# Patient Record
Sex: Female | Born: 1975 | Race: White | Hispanic: No | Marital: Single | Smoking: Former smoker
Health system: Southern US, Community
[De-identification: ages and names within clinical notes are randomized; demographics above are authoritative.]

## PROBLEM LIST (undated history)

## (undated) DIAGNOSIS — F419 Anxiety disorder, unspecified: Secondary | ICD-10-CM

## (undated) DIAGNOSIS — J45909 Unspecified asthma, uncomplicated: Secondary | ICD-10-CM

## (undated) DIAGNOSIS — K439 Ventral hernia without obstruction or gangrene: Secondary | ICD-10-CM

## (undated) DIAGNOSIS — Z9889 Other specified postprocedural states: Secondary | ICD-10-CM

## (undated) DIAGNOSIS — R112 Nausea with vomiting, unspecified: Secondary | ICD-10-CM

## (undated) DIAGNOSIS — K589 Irritable bowel syndrome without diarrhea: Secondary | ICD-10-CM

## (undated) HISTORY — PX: NASAL TURBINATE REDUCTION: SHX2072

---

## 1999-01-29 ENCOUNTER — Encounter: Payer: Self-pay | Admitting: Emergency Medicine

## 1999-01-29 ENCOUNTER — Emergency Department (HOSPITAL_COMMUNITY): Admission: EM | Admit: 1999-01-29 | Discharge: 1999-01-29 | Payer: Self-pay | Admitting: Emergency Medicine

## 1999-12-05 ENCOUNTER — Inpatient Hospital Stay (HOSPITAL_COMMUNITY): Admission: AD | Admit: 1999-12-05 | Discharge: 1999-12-05 | Payer: Self-pay | Admitting: Obstetrics

## 1999-12-20 ENCOUNTER — Encounter: Admission: RE | Admit: 1999-12-20 | Discharge: 1999-12-20 | Payer: Self-pay | Admitting: Obstetrics

## 2000-05-04 ENCOUNTER — Emergency Department (HOSPITAL_COMMUNITY): Admission: EM | Admit: 2000-05-04 | Discharge: 2000-05-04 | Payer: Self-pay | Admitting: Internal Medicine

## 2000-10-08 ENCOUNTER — Encounter: Payer: Self-pay | Admitting: Emergency Medicine

## 2000-10-08 ENCOUNTER — Emergency Department (HOSPITAL_COMMUNITY): Admission: EM | Admit: 2000-10-08 | Discharge: 2000-10-09 | Payer: Self-pay | Admitting: Emergency Medicine

## 2000-10-12 ENCOUNTER — Ambulatory Visit (HOSPITAL_COMMUNITY): Admission: RE | Admit: 2000-10-12 | Discharge: 2000-10-12 | Payer: Self-pay | Admitting: Orthopedic Surgery

## 2000-10-12 ENCOUNTER — Encounter: Payer: Self-pay | Admitting: Orthopedic Surgery

## 2001-01-31 ENCOUNTER — Emergency Department (HOSPITAL_COMMUNITY): Admission: EM | Admit: 2001-01-31 | Discharge: 2001-02-01 | Payer: Self-pay | Admitting: Emergency Medicine

## 2001-01-31 ENCOUNTER — Encounter: Payer: Self-pay | Admitting: Emergency Medicine

## 2001-02-01 ENCOUNTER — Emergency Department (HOSPITAL_COMMUNITY): Admission: EM | Admit: 2001-02-01 | Discharge: 2001-02-01 | Payer: Self-pay | Admitting: Emergency Medicine

## 2001-02-01 ENCOUNTER — Encounter: Payer: Self-pay | Admitting: Emergency Medicine

## 2001-02-02 ENCOUNTER — Inpatient Hospital Stay (HOSPITAL_COMMUNITY): Admission: AD | Admit: 2001-02-02 | Discharge: 2001-02-02 | Payer: Self-pay | Admitting: *Deleted

## 2001-02-02 ENCOUNTER — Encounter: Payer: Self-pay | Admitting: Obstetrics and Gynecology

## 2001-02-14 ENCOUNTER — Encounter: Admission: RE | Admit: 2001-02-14 | Discharge: 2001-02-14 | Payer: Self-pay | Admitting: Internal Medicine

## 2001-08-21 ENCOUNTER — Other Ambulatory Visit: Admission: RE | Admit: 2001-08-21 | Discharge: 2001-08-21 | Payer: Self-pay | Admitting: Gynecology

## 2001-12-23 ENCOUNTER — Other Ambulatory Visit: Admission: RE | Admit: 2001-12-23 | Discharge: 2001-12-23 | Payer: Self-pay | Admitting: Gynecology

## 2002-11-14 ENCOUNTER — Other Ambulatory Visit: Admission: RE | Admit: 2002-11-14 | Discharge: 2002-11-14 | Payer: Self-pay | Admitting: Obstetrics and Gynecology

## 2002-12-15 ENCOUNTER — Inpatient Hospital Stay (HOSPITAL_COMMUNITY): Admission: AD | Admit: 2002-12-15 | Discharge: 2002-12-16 | Payer: Self-pay | Admitting: Obstetrics and Gynecology

## 2003-02-06 ENCOUNTER — Encounter: Payer: Self-pay | Admitting: Sports Medicine

## 2003-02-06 ENCOUNTER — Encounter: Admission: RE | Admit: 2003-02-06 | Discharge: 2003-02-06 | Payer: Self-pay | Admitting: Sports Medicine

## 2003-10-12 ENCOUNTER — Other Ambulatory Visit: Admission: RE | Admit: 2003-10-12 | Discharge: 2003-10-12 | Payer: Self-pay | Admitting: Obstetrics and Gynecology

## 2004-01-12 ENCOUNTER — Encounter: Admission: RE | Admit: 2004-01-12 | Discharge: 2004-04-11 | Payer: Self-pay | Admitting: Orthopedic Surgery

## 2005-03-30 ENCOUNTER — Other Ambulatory Visit: Admission: RE | Admit: 2005-03-30 | Discharge: 2005-03-30 | Payer: Self-pay | Admitting: Obstetrics and Gynecology

## 2005-07-23 ENCOUNTER — Inpatient Hospital Stay (HOSPITAL_COMMUNITY): Admission: AD | Admit: 2005-07-23 | Discharge: 2005-07-23 | Payer: Self-pay | Admitting: Obstetrics and Gynecology

## 2005-08-18 ENCOUNTER — Inpatient Hospital Stay (HOSPITAL_COMMUNITY): Admission: AD | Admit: 2005-08-18 | Discharge: 2005-08-18 | Payer: Self-pay | Admitting: Obstetrics and Gynecology

## 2005-10-25 ENCOUNTER — Inpatient Hospital Stay (HOSPITAL_COMMUNITY): Admission: AD | Admit: 2005-10-25 | Discharge: 2005-10-25 | Payer: Self-pay | Admitting: Obstetrics and Gynecology

## 2005-10-31 ENCOUNTER — Inpatient Hospital Stay (HOSPITAL_COMMUNITY): Admission: AD | Admit: 2005-10-31 | Discharge: 2005-11-04 | Payer: Self-pay | Admitting: Obstetrics and Gynecology

## 2005-11-06 ENCOUNTER — Inpatient Hospital Stay (HOSPITAL_COMMUNITY): Admission: AD | Admit: 2005-11-06 | Discharge: 2005-11-06 | Payer: Self-pay | Admitting: Obstetrics and Gynecology

## 2006-07-03 HISTORY — PX: DIAGNOSTIC LAPAROSCOPY: SUR761

## 2006-08-17 ENCOUNTER — Ambulatory Visit: Payer: Self-pay | Admitting: Orthopaedic Surgery

## 2009-08-05 ENCOUNTER — Ambulatory Visit: Payer: Self-pay | Admitting: Family Medicine

## 2009-09-15 ENCOUNTER — Ambulatory Visit: Payer: Self-pay | Admitting: Otolaryngology

## 2010-12-12 ENCOUNTER — Other Ambulatory Visit (HOSPITAL_COMMUNITY): Payer: Self-pay | Admitting: Obstetrics and Gynecology

## 2010-12-12 DIAGNOSIS — R1031 Right lower quadrant pain: Secondary | ICD-10-CM

## 2010-12-13 ENCOUNTER — Ambulatory Visit (HOSPITAL_COMMUNITY)
Admission: RE | Admit: 2010-12-13 | Discharge: 2010-12-13 | Disposition: A | Discharge status: Home / Self Care | Payer: PRIVATE HEALTH INSURANCE | Source: Ambulatory Visit | Attending: Obstetrics and Gynecology | Admitting: Obstetrics and Gynecology

## 2010-12-13 DIAGNOSIS — Q762 Congenital spondylolisthesis: Secondary | ICD-10-CM | POA: Insufficient documentation

## 2010-12-13 DIAGNOSIS — R1031 Right lower quadrant pain: Secondary | ICD-10-CM | POA: Insufficient documentation

## 2010-12-13 DIAGNOSIS — R509 Fever, unspecified: Secondary | ICD-10-CM | POA: Insufficient documentation

## 2010-12-13 MED ORDER — IOHEXOL 300 MG/ML  SOLN
100.0000 mL | Freq: Once | INTRAMUSCULAR | Status: AC | PRN
  Administered 2010-12-13: 100 mL via INTRAVENOUS

## 2011-08-25 ENCOUNTER — Ambulatory Visit: Payer: Self-pay | Admitting: Internal Medicine

## 2011-11-08 ENCOUNTER — Other Ambulatory Visit: Payer: Self-pay | Admitting: Obstetrics and Gynecology

## 2012-01-12 ENCOUNTER — Encounter (HOSPITAL_COMMUNITY): Payer: Self-pay | Admitting: Pharmacist

## 2012-01-16 ENCOUNTER — Encounter (HOSPITAL_COMMUNITY)
Admission: RE | Admit: 2012-01-16 | Discharge: 2012-01-16 | Disposition: A | Discharge status: Home / Self Care | Payer: PRIVATE HEALTH INSURANCE | Source: Ambulatory Visit | Attending: Obstetrics and Gynecology | Admitting: Obstetrics and Gynecology

## 2012-01-16 ENCOUNTER — Encounter (HOSPITAL_COMMUNITY): Payer: Self-pay

## 2012-01-16 HISTORY — DX: Other specified postprocedural states: Z98.890

## 2012-01-16 HISTORY — DX: Nausea with vomiting, unspecified: R11.2

## 2012-01-16 HISTORY — DX: Unspecified asthma, uncomplicated: J45.909

## 2012-01-16 HISTORY — DX: Irritable bowel syndrome without diarrhea: K58.9

## 2012-01-16 HISTORY — DX: Anxiety disorder, unspecified: F41.9

## 2012-01-16 LAB — SURGICAL PCR SCREEN
MRSA, PCR: NEGATIVE
Staphylococcus aureus: POSITIVE — AB

## 2012-01-16 LAB — CBC
HCT: 39.4 % (ref 36.0–46.0)
Hemoglobin: 12.9 g/dL (ref 12.0–15.0)
MCHC: 32.7 g/dL (ref 30.0–36.0)
RBC: 4.24 MIL/uL (ref 3.87–5.11)

## 2012-01-16 NOTE — Patient Instructions (Addendum)
20 HANALEI GLACE  01/16/2012   Your procedure is scheduled on:  01/24/12  Enter through the Main Entrance of [facility] at 6 AM.  Pick up the phone at the desk and dial [phone].   Call this number if you have problems the morning of surgery: [phone]   Remember:   Do not eat food:After Midnight.  Do not drink clear liquids: After Midnight.  Take these medicines the morning of surgery with A SIP OF WATER: may take Xanax if needed for anxiety.   Do not wear jewelry, make-up or nail polish.  Do not wear lotions, powders, or perfumes. You may wear deodorant.  Do not shave 48 hours prior to surgery.  Do not bring valuables to the hospital.  Contacts, dentures or bridgework may not be worn into surgery.  Leave suitcase in the car. After surgery it may be brought to your room.  For patients admitted to the hospital, checkout time is 11:00 AM the day of discharge.   Patients discharged the day of surgery will not be allowed to drive home.  Name and phone number of your driver: Manny  Significant other  Special Instructions: CHG Shower Use Special Wash: 1/2 bottle night before surgery and 1/2 bottle morning of surgery.   Please read over the following fact sheets that you were given: MRSA Information

## 2012-01-23 NOTE — H&P (Signed)
36 year old female with history of known endometriosis presents for diagnostic laparoscopy. She presents with progressive pelvic pain. Unrelieved by pain meds or birth control pills.  Medical history  Endometriosis  Surgical history  Laparoscopy  Medications None  NKDA  Afebrile Vital signs stable General alert and oriented Lung CTAB Car RRR Abdomen is soft and non tender Pelvic is significant for tenderness both pelvic sidewalls  IMPRESSION: Chronic Pelvic pain Possible recurrent endometriosis  PLAN: Diagnostic laparoscopy Risks reviewed with the patient.

## 2012-01-24 ENCOUNTER — Encounter (HOSPITAL_COMMUNITY): Payer: Self-pay | Admitting: Anesthesiology

## 2012-01-24 ENCOUNTER — Encounter (HOSPITAL_COMMUNITY)
Admission: RE | Disposition: A | Discharge status: Home / Self Care | Payer: Self-pay | Source: Ambulatory Visit | Attending: Obstetrics and Gynecology

## 2012-01-24 ENCOUNTER — Ambulatory Visit (HOSPITAL_COMMUNITY)
Admission: RE | Admit: 2012-01-24 | Discharge: 2012-01-24 | Disposition: A | Discharge status: Home / Self Care | Payer: PRIVATE HEALTH INSURANCE | Source: Ambulatory Visit | Attending: Obstetrics and Gynecology | Admitting: Obstetrics and Gynecology

## 2012-01-24 ENCOUNTER — Ambulatory Visit (HOSPITAL_COMMUNITY): Payer: PRIVATE HEALTH INSURANCE | Admitting: Anesthesiology

## 2012-01-24 DIAGNOSIS — N949 Unspecified condition associated with female genital organs and menstrual cycle: Secondary | ICD-10-CM | POA: Insufficient documentation

## 2012-01-24 DIAGNOSIS — N803 Endometriosis of pelvic peritoneum, unspecified: Secondary | ICD-10-CM | POA: Insufficient documentation

## 2012-01-24 DIAGNOSIS — Z01818 Encounter for other preprocedural examination: Secondary | ICD-10-CM | POA: Insufficient documentation

## 2012-01-24 DIAGNOSIS — Z01812 Encounter for preprocedural laboratory examination: Secondary | ICD-10-CM | POA: Insufficient documentation

## 2012-01-24 DIAGNOSIS — N801 Endometriosis of ovary: Secondary | ICD-10-CM

## 2012-01-24 HISTORY — PX: LAPAROSCOPY: SHX197

## 2012-01-24 LAB — PREGNANCY, URINE: Preg Test, Ur: NEGATIVE

## 2012-01-24 SURGERY — LAPAROSCOPY OPERATIVE
Anesthesia: General | Site: Abdomen | Wound class: Clean Contaminated

## 2012-01-24 MED ORDER — PROPOFOL 10 MG/ML IV EMUL
INTRAVENOUS | Status: DC | PRN
  Administered 2012-01-24: 150 mg via INTRAVENOUS

## 2012-01-24 MED ORDER — DEXAMETHASONE SODIUM PHOSPHATE 4 MG/ML IJ SOLN
INTRAMUSCULAR | Status: DC | PRN
  Administered 2012-01-24: 10 mg via INTRAVENOUS

## 2012-01-24 MED ORDER — FENTANYL CITRATE 0.05 MG/ML IJ SOLN
INTRAMUSCULAR | Status: AC
  Administered 2012-01-24: 50 ug via INTRAVENOUS
  Filled 2012-01-24: qty 2

## 2012-01-24 MED ORDER — CEFAZOLIN SODIUM-DEXTROSE 2-3 GM-% IV SOLR
INTRAVENOUS | Status: AC
  Administered 2012-01-24: 2 g via INTRAVENOUS
  Filled 2012-01-24: qty 50

## 2012-01-24 MED ORDER — CEFAZOLIN SODIUM-DEXTROSE 2-3 GM-% IV SOLR: 2.0000 g | INTRAVENOUS | Status: DC

## 2012-01-24 MED ORDER — ONDANSETRON HCL 4 MG/2ML IJ SOLN
INTRAMUSCULAR | Status: AC
  Filled 2012-01-24: qty 2

## 2012-01-24 MED ORDER — ONDANSETRON HCL 4 MG/2ML IJ SOLN
INTRAMUSCULAR | Status: DC | PRN
  Administered 2012-01-24: 4 mg via INTRAVENOUS

## 2012-01-24 MED ORDER — KETOROLAC TROMETHAMINE 30 MG/ML IJ SOLN
INTRAMUSCULAR | Status: AC
  Filled 2012-01-24: qty 1

## 2012-01-24 MED ORDER — DEXAMETHASONE SODIUM PHOSPHATE 10 MG/ML IJ SOLN
INTRAMUSCULAR | Status: AC
  Filled 2012-01-24: qty 1

## 2012-01-24 MED ORDER — FENTANYL CITRATE 0.05 MG/ML IJ SOLN
INTRAMUSCULAR | Status: AC
  Filled 2012-01-24: qty 2

## 2012-01-24 MED ORDER — KETOROLAC TROMETHAMINE 30 MG/ML IJ SOLN
INTRAMUSCULAR | Status: DC | PRN
  Administered 2012-01-24: 30 mg via INTRAVENOUS

## 2012-01-24 MED ORDER — LIDOCAINE HCL (CARDIAC) 20 MG/ML IV SOLN
INTRAVENOUS | Status: DC | PRN
  Administered 2012-01-24: 50 mg via INTRAVENOUS

## 2012-01-24 MED ORDER — FENTANYL CITRATE 0.05 MG/ML IJ SOLN
25.0000 ug | INTRAMUSCULAR | Status: DC | PRN
  Administered 2012-01-24: 50 ug via INTRAVENOUS
  Administered 2012-01-24: 25 ug via INTRAVENOUS
  Administered 2012-01-24: 50 ug via INTRAVENOUS

## 2012-01-24 MED ORDER — LACTATED RINGERS IV SOLN
INTRAVENOUS | Status: DC
  Administered 2012-01-24: 07:00:00 via INTRAVENOUS

## 2012-01-24 MED ORDER — OXYCODONE-ACETAMINOPHEN 5-325 MG PO TABS
1.0000 | ORAL_TABLET | ORAL | Status: DC | PRN
  Administered 2012-01-24: 1 via ORAL

## 2012-01-24 MED ORDER — OXYCODONE-ACETAMINOPHEN 5-325 MG PO TABS
ORAL_TABLET | ORAL | Status: AC
  Administered 2012-01-24: 1 via ORAL
  Filled 2012-01-24: qty 1

## 2012-01-24 MED ORDER — PROPOFOL 10 MG/ML IV EMUL
INTRAVENOUS | Status: AC
  Filled 2012-01-24: qty 20

## 2012-01-24 MED ORDER — NEOSTIGMINE METHYLSULFATE 1 MG/ML IJ SOLN
INTRAMUSCULAR | Status: AC
  Filled 2012-01-24: qty 10

## 2012-01-24 MED ORDER — SODIUM CHLORIDE 0.9 % IJ SOLN
INTRAMUSCULAR | Status: DC | PRN
  Administered 2012-01-24: 10 mL

## 2012-01-24 MED ORDER — ROCURONIUM BROMIDE 50 MG/5ML IV SOLN
INTRAVENOUS | Status: AC
  Filled 2012-01-24: qty 1

## 2012-01-24 MED ORDER — BUPIVACAINE HCL (PF) 0.25 % IJ SOLN
INTRAMUSCULAR | Status: AC
  Filled 2012-01-24: qty 30

## 2012-01-24 MED ORDER — MIDAZOLAM HCL 5 MG/5ML IJ SOLN
INTRAMUSCULAR | Status: DC | PRN
  Administered 2012-01-24: 2 mg via INTRAVENOUS

## 2012-01-24 MED ORDER — BUPIVACAINE HCL (PF) 0.25 % IJ SOLN
INTRAMUSCULAR | Status: DC | PRN
  Administered 2012-01-24: 4 mL

## 2012-01-24 MED ORDER — SUCCINYLCHOLINE CHLORIDE 20 MG/ML IJ SOLN
INTRAMUSCULAR | Status: AC
  Filled 2012-01-24: qty 10

## 2012-01-24 MED ORDER — LIDOCAINE HCL (CARDIAC) 20 MG/ML IV SOLN
INTRAVENOUS | Status: AC
  Filled 2012-01-24: qty 5

## 2012-01-24 MED ORDER — NEOSTIGMINE METHYLSULFATE 1 MG/ML IJ SOLN
INTRAMUSCULAR | Status: DC | PRN
  Administered 2012-01-24: 3 mg via INTRAVENOUS

## 2012-01-24 MED ORDER — GLYCOPYRROLATE 0.2 MG/ML IJ SOLN
INTRAMUSCULAR | Status: DC | PRN
  Administered 2012-01-24: 0.4 mg via INTRAVENOUS

## 2012-01-24 MED ORDER — ROCURONIUM BROMIDE 100 MG/10ML IV SOLN
INTRAVENOUS | Status: DC | PRN
  Administered 2012-01-24: 10 mg via INTRAVENOUS
  Administered 2012-01-24: 5 mg via INTRAVENOUS

## 2012-01-24 MED ORDER — SUCCINYLCHOLINE CHLORIDE 20 MG/ML IJ SOLN
INTRAMUSCULAR | Status: DC | PRN
  Administered 2012-01-24: 90 mg via INTRAVENOUS

## 2012-01-24 MED ORDER — GLYCOPYRROLATE 0.2 MG/ML IJ SOLN
INTRAMUSCULAR | Status: AC
  Filled 2012-01-24: qty 1

## 2012-01-24 MED ORDER — LACTATED RINGERS IV SOLN
INTRAVENOUS | Status: DC
  Administered 2012-01-24: 50 mL/h via INTRAVENOUS

## 2012-01-24 MED ORDER — FENTANYL CITRATE 0.05 MG/ML IJ SOLN
INTRAMUSCULAR | Status: AC
  Administered 2012-01-24: 25 ug via INTRAVENOUS
  Filled 2012-01-24: qty 2

## 2012-01-24 MED ORDER — MIDAZOLAM HCL 2 MG/2ML IJ SOLN
INTRAMUSCULAR | Status: AC
  Filled 2012-01-24: qty 2

## 2012-01-24 MED ORDER — MEPERIDINE HCL 25 MG/ML IJ SOLN: 6.2500 mg | INTRAMUSCULAR | Status: DC | PRN

## 2012-01-24 MED ORDER — METOCLOPRAMIDE HCL 5 MG/ML IJ SOLN: 10.0000 mg | Freq: Once | INTRAMUSCULAR | Status: DC | PRN

## 2012-01-24 MED ORDER — FENTANYL CITRATE 0.05 MG/ML IJ SOLN
INTRAMUSCULAR | Status: DC | PRN
  Administered 2012-01-24 (×4): 50 ug via INTRAVENOUS

## 2012-01-24 SURGICAL SUPPLY — 23 items
CABLE HIGH FREQUENCY MONO STRZ (ELECTRODE) IMPLANT
CATH ROBINSON RED A/P 16FR (CATHETERS) ×2 IMPLANT
CHLORAPREP W/TINT 26ML (MISCELLANEOUS) ×2 IMPLANT
CLOTH BEACON ORANGE TIMEOUT ST (SAFETY) ×2 IMPLANT
EVACUATOR SMOKE 8.L (FILTER) IMPLANT
GLOVE BIO SURGEON STRL SZ 6.5 (GLOVE) ×4 IMPLANT
GOWN PREVENTION PLUS LG XLONG (DISPOSABLE) ×4 IMPLANT
NEEDLE INSUFFLATION 14GA 120MM (NEEDLE) ×2 IMPLANT
NS IRRIG 1000ML POUR BTL (IV SOLUTION) ×2 IMPLANT
PACK LAPAROSCOPY BASIN (CUSTOM PROCEDURE TRAY) ×2 IMPLANT
POUCH SPECIMEN RETRIEVAL 10MM (ENDOMECHANICALS) IMPLANT
PROTECTOR NERVE ULNAR (MISCELLANEOUS) ×2 IMPLANT
SEALER TISSUE G2 CVD JAW 35 (ENDOMECHANICALS) IMPLANT
SEALER TISSUE G2 CVD JAW 45CM (ENDOMECHANICALS) IMPLANT
SET IRRIG TUBING LAPAROSCOPIC (IRRIGATION / IRRIGATOR) IMPLANT
SUT VIC AB 3-0 PS2 18 (SUTURE) ×1
SUT VIC AB 3-0 PS2 18XBRD (SUTURE) ×1 IMPLANT
SUT VICRYL 0 UR6 27IN ABS (SUTURE) ×2 IMPLANT
TOWEL OR 17X24 6PK STRL BLUE (TOWEL DISPOSABLE) ×4 IMPLANT
TROCAR Z-THREAD BLADED 11X100M (TROCAR) ×2 IMPLANT
TROCAR Z-THREAD BLADED 5X100MM (TROCAR) ×2 IMPLANT
WARMER LAPAROSCOPE (MISCELLANEOUS) ×2 IMPLANT
WATER STERILE IRR 1000ML POUR (IV SOLUTION) IMPLANT

## 2012-01-24 NOTE — Transfer of Care (Signed)
Immediate Anesthesia Transfer of Care Note  Patient: Regina Aguilar  Procedure(s) Performed: Procedure(s) (LRB): LAPAROSCOPY OPERATIVE (N/A)  Patient Location: PACU  Anesthesia Type: General  Level of Consciousness: awake, alert  and oriented  Airway & Oxygen Therapy: Patient Spontanous Breathing and Patient connected to nasal cannula oxygen  Post-op Assessment: Report given to PACU RN and Post -op Vital signs reviewed and stable  Post vital signs: Reviewed and stable  Complications: No apparent anesthesia complications

## 2012-01-24 NOTE — Anesthesia Postprocedure Evaluation (Signed)
Anesthesia Post Note  Patient: Regina Aguilar  Procedure(s) Performed: Procedure(s) (LRB): LAPAROSCOPY OPERATIVE (N/A)  Anesthesia type: GA  Patient location: PACU  Post pain: Pain level controlled  Post assessment: Post-op Vital signs reviewed  Last Vitals:  Filed Vitals:   01/24/12 0845  BP: 94/47  Pulse: 58  Temp:   Resp: 16    Post vital signs: Reviewed  Level of consciousness: sedated  Complications: No apparent anesthesia complications

## 2012-01-24 NOTE — Progress Notes (Signed)
History and physical on the chart. No significant changes Consent signed. 

## 2012-01-24 NOTE — Brief Op Note (Signed)
01/24/2012  7:55 AM  PATIENT:  Regina Aguilar  36 y.o. female  PRE-OPERATIVE DIAGNOSIS:  chronic pelvic pain, endometriosis  POST-OPERATIVE DIAGNOSIS:  chronic pelvic pain, endometriosis involving right round ligament and left mesosalpinx  PROCEDURE:  Procedure(s) (LRB): LAPAROSCOPY OPERATIVE (N/A) Fulguration of Endometriosis SURGEON:  Surgeon(s) and Role:    * Jeani Hawking, MD - Primary  PHYSICIAN ASSISTANT:   ASSISTANTS: none   ANESTHESIA:   general  EBL:  Total I/O In: 400 [I.V.:400] Out: -   BLOOD ADMINISTERED:none  DRAINS: none   LOCAL MEDICATIONS USED:  [name]     SPECIMEN:  Source of Specimen:  endometriotic implant from right round ligament  DISPOSITION OF SPECIMEN:  PATHOLOGY  COUNTS:  YES  TOURNIQUET:  * No tourniquets in log *  DICTATION: .Other Dictation: Dictation Number [id number]  PLAN OF CARE: Discharge to home after PACU  PATIENT DISPOSITION:  PACU - hemodynamically stable.   Delay start of Pharmacological VTE agent (>24hrs) due to surgical blood loss or risk of bleeding: not applicable

## 2012-01-24 NOTE — Op Note (Signed)
NAMEELFA, WOOTON          ACCOUNT NO.:  [id number]  MEDICAL RECORD NO.:  [id number]  LOCATION:  [facility]                          FACILITY:  [facility]  PHYSICIAN:  Greer Koeppen L. Delrose Rohwer, M.D.DATE OF BIRTH:  May 27, 1976  DATE OF PROCEDURE:  01/24/2012 DATE OF DISCHARGE:                              OPERATIVE REPORT   PREOPERATIVE DIAGNOSES:  Chronic pelvic pain, history of endometriosis.  POSTOPERATIVE DIAGNOSES:  Chronic pelvic pain and history of endometriosis with endometriotic implant on the right round ligament insertion to the abdominal wall and left mesosalpinx.  SURGERY:  Diagnostic laparoscopy, fulguration of endometriosis, and removal of endometriotic implants.  SURGEON:  Aaria Happ L. Vincente Poli, MD.  ASSISTANT:  None.  ANESTHESIA:  General.  ESTIMATED BLOOD LOSS:  Minimal.  COMPLICATIONS:  None.  PROCEDURE IN DETAIL:  After the patient had been counseled on the risks of the procedure, she was then taken to the operating room.  She was intubated.  She was prepped and draped.  An in and out catheter was used to empty the bladder.  A uterine manipulator was inserted.  Attention was turned to the abdomen where a small infraumbilical incision was made.  The Veress needle was inserted and pneumoperitoneum was performed.  The Veress needle was removed and 11-mm trocar was inserted. We then placed the scope through the trocar sheath and then placed a 5- mm trocar suprapubically under direct visualization.  We did a thorough exploration of the abdomen and peritoneal cavity.  The upper abdomen appeared normal.  Uterus was normal.  She is status post tubal ligation. Ovaries appeared normal.  No brownish spots on ovaries.  No adhesions were noted.  She had two brown endometriotic implants, one was on the right round ligament at its point of insertion near the inguinal canal. The second was on the left mesosalpinx midportion.  The mesosalpinx endometriotic implant, I was able to  easily pick that up with Kleppinger and cauterized and fulgurated that completely.  The one on the round ligament, I decided to resect that, so we elevated it with graspers, removed it with scissors, sent it to Pathology, and then fulgurated the area surrounding that with Kleppinger.  Hemostasis was excellent. Pneumoperitoneum was released.  The trocars were removed.  The incisions were closed with 3-0 Vicryl interrupted.  All sponge, lap, and instrument counts were correct x2.  The patient went to recovery room in stable condition.     Gordon Vandunk L. Vincente Poli, M.D.     Florestine Avers  D:  01/24/2012  T:  01/24/2012  Job:  [id number]

## 2012-01-24 NOTE — Anesthesia Preprocedure Evaluation (Addendum)
Anesthesia Evaluation  Patient identified by MRN, date of birth, ID band Patient awake    Reviewed: Allergy & Precautions, H&P , NPO status , Patient's Chart, lab work & pertinent test results  History of Anesthesia Complications (+) PONV  Airway Mallampati: II TM Distance: >3 FB Neck ROM: Full    Dental No notable dental hx. (+) Teeth Intact   Pulmonary asthma ,  breath sounds clear to auscultation  Pulmonary exam normal       Cardiovascular negative cardio ROS  Rhythm:Regular Rate:Normal     Neuro/Psych Anxiety negative neurological ROS     GI/Hepatic Neg liver ROS, IBS   Endo/Other  negative endocrine ROS  Renal/GU negative Renal ROS  negative genitourinary   Musculoskeletal negative musculoskeletal ROS (+)   Abdominal   Peds  Hematology negative hematology ROS (+)   Anesthesia Other Findings   Reproductive/Obstetrics Chronic Pelvic Pain                          Anesthesia Physical Anesthesia Plan  ASA: II  Anesthesia Plan: General   Post-op Pain Management:    Induction: Intravenous  Airway Management Planned: Oral ETT  Additional Equipment:   Intra-op Plan:   Post-operative Plan: Extubation in OR  Informed Consent: I have reviewed the patients History and Physical, chart, labs and discussed the procedure including the risks, benefits and alternatives for the proposed anesthesia with the patient or authorized representative who has indicated his/her understanding and acceptance.   Dental advisory given  Plan Discussed with: CRNA, Anesthesiologist and Surgeon  Anesthesia Plan Comments:         Anesthesia Quick Evaluation

## 2012-01-25 ENCOUNTER — Encounter (HOSPITAL_COMMUNITY): Payer: Self-pay | Admitting: Obstetrics and Gynecology

## 2012-12-18 ENCOUNTER — Other Ambulatory Visit: Payer: Self-pay | Admitting: Obstetrics and Gynecology

## 2013-07-09 ENCOUNTER — Ambulatory Visit (INDEPENDENT_AMBULATORY_CARE_PROVIDER_SITE_OTHER): Payer: Self-pay | Admitting: Surgery

## 2013-09-02 ENCOUNTER — Ambulatory Visit: Payer: Self-pay | Admitting: Physician Assistant

## 2013-09-07 ENCOUNTER — Emergency Department: Payer: Self-pay | Admitting: Emergency Medicine

## 2013-11-03 ENCOUNTER — Ambulatory Visit: Payer: Self-pay | Admitting: Internal Medicine

## 2014-01-12 ENCOUNTER — Other Ambulatory Visit: Payer: Self-pay | Admitting: Obstetrics and Gynecology

## 2014-01-13 LAB — CYTOLOGY - PAP

## 2014-09-01 DIAGNOSIS — M79604 Pain in right leg: Secondary | ICD-10-CM | POA: Insufficient documentation

## 2014-09-08 DIAGNOSIS — M792 Neuralgia and neuritis, unspecified: Secondary | ICD-10-CM | POA: Insufficient documentation

## 2014-09-08 DIAGNOSIS — G894 Chronic pain syndrome: Secondary | ICD-10-CM | POA: Insufficient documentation

## 2015-02-25 ENCOUNTER — Other Ambulatory Visit: Payer: Self-pay | Admitting: Obstetrics and Gynecology

## 2015-02-26 LAB — CYTOLOGY - PAP

## 2015-03-11 ENCOUNTER — Other Ambulatory Visit: Payer: Self-pay | Admitting: Internal Medicine

## 2015-03-11 ENCOUNTER — Ambulatory Visit
Admission: RE | Admit: 2015-03-11 | Discharge: 2015-03-11 | Disposition: A | Discharge status: Home / Self Care | Payer: BLUE CROSS/BLUE SHIELD | Source: Ambulatory Visit | Attending: Internal Medicine | Admitting: Internal Medicine

## 2015-03-11 DIAGNOSIS — R05 Cough: Secondary | ICD-10-CM

## 2015-03-11 DIAGNOSIS — R059 Cough, unspecified: Secondary | ICD-10-CM

## 2015-08-16 ENCOUNTER — Emergency Department
Admission: EM | Admit: 2015-08-16 | Discharge: 2015-08-16 | Disposition: A | Discharge status: Home / Self Care | Payer: BLUE CROSS/BLUE SHIELD | Attending: Emergency Medicine | Admitting: Emergency Medicine

## 2015-08-16 ENCOUNTER — Emergency Department: Payer: BLUE CROSS/BLUE SHIELD

## 2015-08-16 ENCOUNTER — Encounter: Payer: Self-pay | Admitting: Emergency Medicine

## 2015-08-16 DIAGNOSIS — R1110 Vomiting, unspecified: Secondary | ICD-10-CM | POA: Insufficient documentation

## 2015-08-16 DIAGNOSIS — Z79899 Other long term (current) drug therapy: Secondary | ICD-10-CM | POA: Insufficient documentation

## 2015-08-16 DIAGNOSIS — Y92007 Garden or yard of unspecified non-institutional (private) residence as the place of occurrence of the external cause: Secondary | ICD-10-CM | POA: Insufficient documentation

## 2015-08-16 DIAGNOSIS — S43401A Unspecified sprain of right shoulder joint, initial encounter: Secondary | ICD-10-CM | POA: Diagnosis not present

## 2015-08-16 DIAGNOSIS — Y9389 Activity, other specified: Secondary | ICD-10-CM | POA: Insufficient documentation

## 2015-08-16 DIAGNOSIS — W010XXA Fall on same level from slipping, tripping and stumbling without subsequent striking against object, initial encounter: Secondary | ICD-10-CM | POA: Insufficient documentation

## 2015-08-16 DIAGNOSIS — Y998 Other external cause status: Secondary | ICD-10-CM | POA: Diagnosis not present

## 2015-08-16 DIAGNOSIS — Z87891 Personal history of nicotine dependence: Secondary | ICD-10-CM | POA: Insufficient documentation

## 2015-08-16 DIAGNOSIS — S4991XA Unspecified injury of right shoulder and upper arm, initial encounter: Secondary | ICD-10-CM | POA: Diagnosis present

## 2015-08-16 MED ORDER — IBUPROFEN 800 MG PO TABS: 800.0000 mg | ORAL_TABLET | Freq: Three times a day (TID) | ORAL | Status: DC | PRN

## 2015-08-16 MED ORDER — TRAMADOL HCL 50 MG PO TABS: 50.0000 mg | ORAL_TABLET | Freq: Four times a day (QID) | ORAL | Status: DC | PRN

## 2015-08-16 MED ORDER — IBUPROFEN 600 MG PO TABS
600.0000 mg | ORAL_TABLET | Freq: Once | ORAL | Status: AC
Start: 2015-08-16 — End: 2015-08-16
  Administered 2015-08-16: 600 mg via ORAL
  Filled 2015-08-16: qty 1

## 2015-08-16 MED ORDER — OXYCODONE-ACETAMINOPHEN 5-325 MG PO TABS
1.0000 | ORAL_TABLET | Freq: Once | ORAL | Status: AC
  Administered 2015-08-16: 1 via ORAL
  Filled 2015-08-16: qty 1

## 2015-08-16 NOTE — ED Provider Notes (Signed)
[facility], [place] Emergency Department Provider Note  ____________________________________________  Time seen: Approximately 2:12 PM  I have reviewed the triage vital signs and the nursing notes.   HISTORY  Chief Complaint Fall    HPI Regina Aguilar is a 40 y.o. female presents with right shoulder pain. The patient was playing with her children outside on Saturday night. She tripped on a stump and fell on outstretched hands. She complains of right anterior shoulder pain that extends to the posterior aspect of the shoulder. She notes hearing a "pop" sound in the shoulder while adjusting herself today. She had relief with percocet, lying down, ice and heat. Pain is exacerbated by movement and letting the arm hang. The patient noted significant swelling of the arm yesterday but this has improved. She notes numbness and tingling radiating down her right arm. She currently ranks the severity of pain an 8-9/10.   Past Medical History  Diagnosis Date  . PONV (postoperative nausea and vomiting)   . Asthma     only with resp infection  . IBS (irritable bowel syndrome)   . Anxiety     There are no active problems to display for this patient.   Past Surgical History  Procedure Laterality Date  . Diagnostic laparoscopy  2008  . Nasal turbinate reduction    . Cesarean section    . Laparoscopy  01/24/2012    Procedure: LAPAROSCOPY OPERATIVE;  Surgeon: Jeani Hawking, MD;  Location: [facility];  Service: Gynecology;  Laterality: N/A;  Fulgeration of endometriosis    Current Outpatient Rx  Name  Route  Sig  Dispense  Refill  . albuterol (PROVENTIL HFA;VENTOLIN HFA) 108 (90 BASE) MCG/ACT inhaler   Inhalation   Inhale 2 puffs into the lungs every 6 (six) hours as needed. For shortness of breath or wheezing         . ALPRAZolam (XANAX) 0.5 MG tablet   Oral   Take 0.5 mg by mouth daily as needed. For anxiety         . desogestrel-ethinyl estradiol (AZURETTE)  0.15-0.02/0.01 MG (21/5) tablet   Oral   Take 1 tablet by mouth daily.         Marland Kitchen ibuprofen (ADVIL,MOTRIN) 800 MG tablet   Oral   Take 1 tablet (800 mg total) by mouth every 8 (eight) hours as needed.   30 tablet   0   . temazepam (RESTORIL) 30 MG capsule   Oral   Take 30 mg by mouth at bedtime.         . traMADol (ULTRAM) 50 MG tablet   Oral   Take 1 tablet (50 mg total) by mouth every 6 (six) hours as needed.   20 tablet   0     Allergies Review of patient's allergies indicates no known allergies.  History reviewed. No pertinent family history.  Social History Social History  Substance Use Topics  . Smoking status: Former Games developer  . Smokeless tobacco: None  . Alcohol Use: Yes     Comment: rarely    Review of Systems Constitutional: No fever/chills Gastrointestinal: Endorses vomiting due to stress.  No diarrhea.  No constipation. Genitourinary: Negative for dysuria. Musculoskeletal: Right arm pain. See above. Skin: Negative for rash. Neurological: Numbness and tingling down right arm 10-point ROS otherwise negative.  ____________________________________________   PHYSICAL EXAM:  VITAL SIGNS: ED Triage Vitals  Enc Vitals Group     BP 08/16/15 1333 119/85 mmHg     Pulse Rate 08/16/15  1333 90     Resp 08/16/15 1333 20     Temp 08/16/15 1333 98.3 F (36.8 C)     Temp Source 08/16/15 1333 Oral     SpO2 08/16/15 1333 98 %     Weight 08/16/15 1333 115 lb (52.164 kg)     Height 08/16/15 1333  (1.6 m)     Head Cir --      Peak Flow --      Pain Score 08/16/15 1325 10     Pain Loc --      Pain Edu? --      Excl. in GC? --     Constitutional: Alert and oriented. Well appearing and in no acute distress. Patient is splinting right arm. Eyes: Conjunctivae are normal.  Head: Atraumatic. Nose: No congestion/rhinnorhea. Musculoskeletal: Limited extension, lateral flexion and rotation of right arm.  Neurologic:  Normal speech and language. No gross  focal neurologic deficits are appreciated.  Skin:  Skin is warm, dry and intact. No rash noted. Psychiatric: Mood and affect are normal. Speech and behavior are normal.  ____________________________________________   LABS (all labs ordered are listed, but only abnormal results are displayed)  Labs Reviewed - No data to display ____________________________________________  EKG   ____________________________________________  RADIOLOGY  No acute findings on right shoulder x-ray. I, Joni Reining, personally viewed and evaluated these images (plain radiographs) as part of my medical decision making, as well as reviewing the written report by the radiologist.  ____________________________________________   PROCEDURES  Procedure(s) performed: None  Critical Care performed: No  ____________________________________________   INITIAL IMPRESSION / ASSESSMENT AND PLAN / ED COURSE  Pertinent labs & imaging results that were available during my care of the patient were reviewed by me and considered in my medical decision making (see chart for details).  Right shoulder strain. Discussed x-ray findings with palpation. Patient places a sling and advised to wear for 3-5 days as needed. Patient given a prescription for tramadol and ibuprofen. Patient advised to follow-up with family doctor if no improvement in 3 days. Return back to the ER if condition worsens. ____________________________________________   FINAL CLINICAL IMPRESSION(S) / ED DIAGNOSES  Final diagnoses:  Shoulder sprain, right, initial encounter      Joni Reining, PA-C 08/16/15 1444  Rockne Menghini, MD 08/16/15 1529

## 2015-08-16 NOTE — ED Notes (Signed)
States she tripped over a root in the yard  Landed face first  Having pain to right shoulder/arm area

## 2015-08-16 NOTE — Discharge Instructions (Signed)
With sling for 3-5 days as needed. Shoulder Sprain A shoulder sprain is a partial or complete tear in one of the tough, fiber-like tissues (ligaments) in the shoulder. The ligaments in the shoulder help to hold the shoulder in place. CAUSES This condition may be caused by:  A fall.  A hit to the shoulder.  A twist of the arm. RISK FACTORS This condition is more likely to develop in:  People who play sports.  People who have problems with balance or coordination. SYMPTOMS Symptoms of this condition include:  Pain when moving the shoulder.  Limited ability to move the shoulder.  Swelling and tenderness on top of the shoulder.  Warmth in the shoulder.  A change in the shape of the shoulder.  Redness or bruising on the shoulder. DIAGNOSIS This condition is diagnosed with a physical exam. During the exam, you may be asked to do simple exercises with your shoulder. You may also have imaging tests, such as X-rays, MRI, or a CT scan. These tests can show how severe the sprain is. TREATMENT This condition may be treated with:  Rest.  Pain medicine.  Ice.  A sling or brace. This is used to keep the arm still while the shoulder is healing.  Physical therapy or rehabilitation exercises. These help to improve the range of motion and strength of the shoulder.  Surgery (rare). Surgery may be needed if the sprain caused a joint to become unstable. Surgery may also be needed to reduce pain. Some people may develop ongoing shoulder pain or lose some range of motion in the shoulder. However, most people do not develop long-term problems. HOME CARE INSTRUCTIONS  Rest.  Take over-the-counter and prescription medicines only as told by your health care provider.  If directed, apply ice to the area:  Put ice in a plastic bag.  Place a towel between your skin and the bag.  Leave the ice on for 20 minutes, 2-3 times per day.  If you were given a shoulder sling or brace:  Wear it  as told.  Remove it to shower or bathe.  Move your arm only as much as told by your health care provider, but keep your hand moving to prevent swelling.  If you were shown how to do any exercises, do them as told by your health care provider.  Keep all follow-up visits as told by your health care provider. This is important. SEEK MEDICAL CARE IF:  Your pain gets worse.  Your pain is not relieved with medicines.  You have increased redness or swelling. SEEK IMMEDIATE MEDICAL CARE IF:  You have a fever.  You cannot move your arm or shoulder.  You develop numbness or tingling in your arms, hands, or fingers.   This information is not intended to replace advice given to you by your health care provider. Make sure you discuss any questions you have with your health care provider.   Document Released: 11/05/2008 Document Revised: 03/10/2015 Document Reviewed: 10/12/2014 Elsevier Interactive Patient Education [employer]

## 2015-08-16 NOTE — ED Notes (Signed)
Reports playing in the yard with her son and tripped, pain to right shoulder. +PMS to distal hand

## 2015-09-20 DIAGNOSIS — M7520 Bicipital tendinitis, unspecified shoulder: Secondary | ICD-10-CM | POA: Insufficient documentation

## 2015-12-29 ENCOUNTER — Ambulatory Visit (INDEPENDENT_AMBULATORY_CARE_PROVIDER_SITE_OTHER): Payer: BLUE CROSS/BLUE SHIELD | Admitting: General Surgery

## 2015-12-29 ENCOUNTER — Encounter: Payer: Self-pay | Admitting: General Surgery

## 2015-12-29 VITALS — BP 123/72 | HR 80 | Temp 98.3°F | Resp 14 | Ht 63.0 in | Wt 125.0 lb

## 2015-12-29 DIAGNOSIS — L0231 Cutaneous abscess of buttock: Secondary | ICD-10-CM

## 2015-12-29 NOTE — Patient Instructions (Addendum)
Remove the dressing tomorrow. May use water proof dressing for swimming. Follow up in 2 weeks. Start your antibiotics. Tylenol as needed for comfort.

## 2015-12-29 NOTE — Progress Notes (Signed)
Patient ID: Regina Aguilar, female   DOB: 04-09-1976, 40 y.o.   MRN: [id number]  Chief Complaint  Patient presents with  . Abscess    HPI Regina Aguilar is a 40 y.o. female here for evaluation of an abscess of her buttock. She started having a fever on 12/26/15, and the next day she started having discomfort on her left buttock. The area got significantly worse over the next few days. She has a history of Staph infections. No previous infections in this area. HPI  Past Medical History  Diagnosis Date  . PONV (postoperative nausea and vomiting)   . Asthma     only with resp infection  . IBS (irritable bowel syndrome)   . Anxiety     Past Surgical History  Procedure Laterality Date  . Diagnostic laparoscopy  2008  . Nasal turbinate reduction    . Cesarean section    . Laparoscopy  01/24/2012    Procedure: LAPAROSCOPY OPERATIVE;  Surgeon: Jeani HawkingMichelle L Grewal, MD;  Location: [facility];  Service: Gynecology;  Laterality: N/A;  Fulgeration of endometriosis    No family history on file.  Social History Social History  Substance Use Topics  . Smoking status: Former Games developermoker  . Smokeless tobacco: None  . Alcohol Use: Yes     Comment: rarely    Allergies  Allergen Reactions  . Prednisone Swelling    When combined with Penicillin    Current Outpatient Prescriptions  Medication Sig Dispense Refill  . albuterol (PROVENTIL HFA;VENTOLIN HFA) 108 (90 BASE) MCG/ACT inhaler Inhale 2 puffs into the lungs every 6 (six) hours as needed. For shortness of breath or wheezing    . ALPRAZolam (XANAX) 0.5 MG tablet Take 0.5 mg by mouth daily as needed. For anxiety    . cyclobenzaprine (FLEXERIL) 5 MG tablet Take 5 mg by mouth as needed for muscle spasms.    Marland Kitchen. desogestrel-ethinyl estradiol (AZURETTE) 0.15-0.02/0.01 MG (21/5) tablet Take 1 tablet by mouth daily.    . fluticasone (FLONASE) 50 MCG/ACT nasal spray Place 1 spray into both nostrils daily.    . fluticasone furoate-vilanterol  (BREO ELLIPTA) 100-25 MCG/INH AEPB Inhale 1 puff into the lungs daily.    . montelukast (SINGULAIR) 10 MG tablet Take 10 mg by mouth at bedtime.    . norethindrone-ethinyl estradiol (OVCON-35,BALZIVA,BRIELLYN) 0.4-35 MG-MCG tablet Take 1 tablet by mouth daily.    . traMADol (ULTRAM) 50 MG tablet Take 1 tablet (50 mg total) by mouth every 6 (six) hours as needed. 20 tablet 0  . venlafaxine XR (EFFEXOR-XR) 150 MG 24 hr capsule Take 150 mg by mouth daily with breakfast.    . zolpidem (AMBIEN) 10 MG tablet Take 10 mg by mouth at bedtime as needed for sleep.     No current facility-administered medications for this visit.    Review of Systems Review of Systems  Constitutional: Positive for fever. Negative for chills, diaphoresis, activity change, appetite change, fatigue and unexpected weight change.  Respiratory: Negative.   Cardiovascular: Negative.     Blood pressure 123/72, pulse 80, temperature 98.3 F (36.8 C), resp. rate 14, height 5\' 3"  (1.6 m), weight 125 lb (56.7 kg).  Physical Exam Physical Exam  Constitutional: She is oriented to person, place, and time. She appears well-developed and well-nourished.  Eyes: Conjunctivae are normal. No scleral icterus.  Neck: Neck supple.  Cardiovascular: Normal rate, regular rhythm and normal heart sounds.   Pulmonary/Chest: Effort normal and breath sounds normal.  Musculoskeletal:  Legs: Lymphadenopathy:    She has no cervical adenopathy.  Neurological: She is alert and oriented to person, place, and time.  Skin: Skin is warm and dry.  5 cm area of erythema and thickening.       Assessment    Left gluteal abscess.    Plan    Incision and drainage was discussed and acceptable the patient.  ChloraPrep was applied to the skin followed by 10 mL of 0.5% [name] with 0.25% [name] with 1-200,000 of epinephrine. After suitable waiting time ChloraPrep was reapplied. A 1/2 cm long 5 mm ellipse of skin was excised to provide  adequate drainage. There was a small volume of purulent material with some liquefied fat. Loculations were broken up with a hemostat. The procedure was very well tolerated. Dry dressing applied.  Hot compresses encouraged.   The patient will make use of a dry dressing as needed.   Review of the records accompanying the patient shows a prescription for Bactrim DS and tramadol has been provided.   We'll plan for reassessment in 2 weeks.    PCP: Esperanza SheetsEric Turner This has been scribed by Sinda Duaryl-Lyn M Kennedy LPN     Earline MayotteByrnett, Jeffrey W 12/31/2015, 4:16 PM

## 2015-12-31 DIAGNOSIS — L0231 Cutaneous abscess of buttock: Secondary | ICD-10-CM | POA: Insufficient documentation

## 2015-12-31 LAB — AEROBIC CULTURE

## 2016-01-12 ENCOUNTER — Encounter: Payer: Self-pay | Admitting: *Deleted

## 2016-01-13 ENCOUNTER — Encounter: Payer: Self-pay | Admitting: General Surgery

## 2016-01-13 ENCOUNTER — Ambulatory Visit (INDEPENDENT_AMBULATORY_CARE_PROVIDER_SITE_OTHER): Payer: BLUE CROSS/BLUE SHIELD | Admitting: General Surgery

## 2016-01-13 VITALS — BP 104/64 | HR 68 | Resp 12 | Ht 63.0 in | Wt 124.0 lb

## 2016-01-13 DIAGNOSIS — L0231 Cutaneous abscess of buttock: Secondary | ICD-10-CM

## 2016-01-13 NOTE — Patient Instructions (Addendum)
The patient is aware to call back for any questions or concerns. Follow up as needed. 

## 2016-01-13 NOTE — Progress Notes (Signed)
Patient ID: Regina Aguilar, female   DOB: 02/28/1976, 41 y.o.   MRN: [id number]  Chief Complaint  Patient presents with  . Follow-up    HPI Regina Aguilar is a 40 y.o. female.  Here today for follow up from gluteal abscess. She states it is still draining but only a small amount. She has completed her antibiotics.   HPI  Past Medical History  Diagnosis Date  . PONV (postoperative nausea and vomiting)   . Asthma     only with resp infection  . IBS (irritable bowel syndrome)   . Anxiety     Past Surgical History  Procedure Laterality Date  . Diagnostic laparoscopy  2008  . Nasal turbinate reduction    . Cesarean section    . Laparoscopy  01/24/2012    Procedure: LAPAROSCOPY OPERATIVE;  Surgeon: Jeani Hawking, MD;  Location: [facility];  Service: Gynecology;  Laterality: N/A;  Fulgeration of endometriosis    No family history on file.  Social History Social History  Substance Use Topics  . Smoking status: Former Games developer  . Smokeless tobacco: None  . Alcohol Use: Yes     Comment: rarely    Allergies  Allergen Reactions  . Prednisone Swelling    When combined with Penicillin    Current Outpatient Prescriptions  Medication Sig Dispense Refill  . albuterol (PROVENTIL HFA;VENTOLIN HFA) 108 (90 BASE) MCG/ACT inhaler Inhale 2 puffs into the lungs every 6 (six) hours as needed. For shortness of breath or wheezing    . ALPRAZolam (XANAX) 0.5 MG tablet Take 0.5 mg by mouth daily as needed. For anxiety    . cyclobenzaprine (FLEXERIL) 5 MG tablet Take 5 mg by mouth as needed for muscle spasms.    Marland Kitchen desogestrel-ethinyl estradiol (AZURETTE) 0.15-0.02/0.01 MG (21/5) tablet Take 1 tablet by mouth daily.    . fluticasone (FLONASE) 50 MCG/ACT nasal spray Place 1 spray into both nostrils daily.    . fluticasone furoate-vilanterol (BREO ELLIPTA) 100-25 MCG/INH AEPB Inhale 1 puff into the lungs daily.    . montelukast (SINGULAIR) 10 MG tablet Take 10 mg by mouth at bedtime.     . norethindrone-ethinyl estradiol (OVCON-35,BALZIVA,BRIELLYN) 0.4-35 MG-MCG tablet Take 1 tablet by mouth daily.    Marland Kitchen venlafaxine XR (EFFEXOR-XR) 150 MG 24 hr capsule Take 150 mg by mouth daily with breakfast.    . zolpidem (AMBIEN) 10 MG tablet Take 10 mg by mouth at bedtime as needed for sleep.     No current facility-administered medications for this visit.    Review of Systems Review of Systems  Constitutional: Negative.   Respiratory: Negative.   Cardiovascular: Negative.     Blood pressure 104/64, pulse 68, resp. rate 12, height  (1.6 m), weight 124 lb (56.246 kg).  Physical Exam Physical Exam  Constitutional: She is oriented to person, place, and time. She appears well-developed and well-nourished.  Musculoskeletal:       Back:  Neurological: She is alert and oriented to person, place, and time.  Skin: Skin is warm and dry.  Left gluteal 1 x 3 area of thickening and 1 cm open area.  Psychiatric: Her behavior is normal.    Data Reviewed 12/29/2015 culture and sensitivity: MSSA.   Assessment    Good response to incision and drainage. Minimal residual thickening.     Plan    I anticipate the small amount of residual thickening to resolve over the coming weeks. Local heat is again encouraged.  The patient will  contact the office if she has recurrent symptoms.      Follow up as needed. The patient is aware to call back for any questions or concerns.   PCP:  Mayford Knifeurner,  This information has been scribed by Dorathy DaftMarsha Hatch RN, BSN,BC.   Earline MayotteByrnett, Antoine Fiallos W 01/13/2016, 10:06 PM

## 2016-03-08 HISTORY — PX: COLONOSCOPY: SHX174

## 2016-03-13 HISTORY — PX: LAPAROSCOPIC ENDOMETRIOSIS FULGURATION: SUR769

## 2016-03-27 ENCOUNTER — Other Ambulatory Visit (HOSPITAL_COMMUNITY): Payer: Self-pay | Admitting: Obstetrics and Gynecology

## 2016-03-27 ENCOUNTER — Ambulatory Visit (HOSPITAL_COMMUNITY)
Admission: RE | Admit: 2016-03-27 | Discharge: 2016-03-27 | Disposition: A | Discharge status: Home / Self Care | Payer: BLUE CROSS/BLUE SHIELD | Source: Ambulatory Visit | Attending: Physician Assistant | Admitting: Physician Assistant

## 2016-03-27 DIAGNOSIS — M79601 Pain in right arm: Secondary | ICD-10-CM | POA: Insufficient documentation

## 2016-03-27 DIAGNOSIS — M7989 Other specified soft tissue disorders: Principal | ICD-10-CM

## 2016-03-27 NOTE — Progress Notes (Signed)
VASCULAR LAB PRELIMINARY  PRELIMINARY  PRELIMINARY  PRELIMINARY  Right upper extremity venous duplex completed.    Preliminary report:  There is no DVT noted in the right upper extremity.  There is acute SVT noted in the right cephalic vein throughout the anterior right forearm.  There is also superficial thrombosis noted at the knot on wrist and extending up the posterior forearm.  Called report to BorgWarnerCarol Curtis.  Caree Wolpert, RVT 03/27/2016, 4:28 PM

## 2016-04-06 ENCOUNTER — Telehealth: Payer: Self-pay | Admitting: *Deleted

## 2016-04-06 ENCOUNTER — Encounter: Payer: Self-pay | Admitting: General Surgery

## 2016-04-06 ENCOUNTER — Ambulatory Visit (INDEPENDENT_AMBULATORY_CARE_PROVIDER_SITE_OTHER): Payer: BLUE CROSS/BLUE SHIELD | Admitting: General Surgery

## 2016-04-06 VITALS — BP 130/80 | HR 82 | Temp 98.0°F | Resp 14 | Ht 63.0 in | Wt 128.0 lb

## 2016-04-06 DIAGNOSIS — L0231 Cutaneous abscess of buttock: Secondary | ICD-10-CM | POA: Diagnosis not present

## 2016-04-06 MED ORDER — SULFAMETHOXAZOLE-TRIMETHOPRIM 800-160 MG PO TABS: 1.0000 | ORAL_TABLET | Freq: Two times a day (BID) | ORAL | 0 refills | Status: AC

## 2016-04-06 NOTE — Telephone Encounter (Signed)
Appointment made for today per Dr Lemar LivingsByrnett, pt aware

## 2016-04-06 NOTE — Patient Instructions (Signed)
The patient is aware to call back for any questions or concerns.  

## 2016-04-06 NOTE — Progress Notes (Signed)
Patient ID: Regina Aguilar, female   DOB: October 17, 1975, 40 y.o.   MRN: [id number]  Chief Complaint  Patient presents with  . Pain    HPI Regina Aguilar is a 40 y.o. female.  Here today for abscess. She thinks she has a right gluteal abscess. She noticed a small knot last week and it became painful 2 days ago.The buttock is red swollen and painful.  She has a history of a left gluteal abscess drained back in July 2017. She had a colonoscopy for IBS with constipation. HPI  Past Medical History:  Diagnosis Date  . Anxiety   . Asthma    only with resp infection  . IBS (irritable bowel syndrome)   . PONV (postoperative nausea and vomiting)     Past Surgical History:  Procedure Laterality Date  . CESAREAN SECTION    . COLONOSCOPY  03/08/2016   [facility]  . DIAGNOSTIC LAPAROSCOPY  2008  . LAPAROSCOPIC ENDOMETRIOSIS FULGURATION  03/13/2016  . LAPAROSCOPY  01/24/2012   Procedure: LAPAROSCOPY OPERATIVE;  Surgeon: Jeani HawkingMichelle L Grewal, MD;  Location: [facility];  Service: Gynecology;  Laterality: N/A;  Fulgeration of endometriosis  . NASAL TURBINATE REDUCTION      No family history on file.  Social History Social History  Substance Use Topics  . Smoking status: Former Games developermoker  . Smokeless tobacco: Never Used  . Alcohol use Yes     Comment: rarely    Allergies  Allergen Reactions  . Prednisone Swelling    When combined with Penicillin    Current Outpatient Prescriptions  Medication Sig Dispense Refill  . albuterol (PROVENTIL HFA;VENTOLIN HFA) 108 (90 BASE) MCG/ACT inhaler Inhale 2 puffs into the lungs every 6 (six) hours as needed. For shortness of breath or wheezing    . ALPRAZolam (XANAX) 0.5 MG tablet Take 0.5 mg by mouth daily as needed. For anxiety    . cyclobenzaprine (FLEXERIL) 5 MG tablet Take 5 mg by mouth as needed for muscle spasms.    . fluticasone (FLONASE) 50 MCG/ACT nasal spray Place 1 spray into both nostrils daily.    . fluticasone  furoate-vilanterol (BREO ELLIPTA) 100-25 MCG/INH AEPB Inhale 1 puff into the lungs daily.    . Linaclotide (LINZESS PO) Take by mouth daily.    . montelukast (SINGULAIR) 10 MG tablet Take 10 mg by mouth at bedtime.    . norethindrone-ethinyl estradiol (OVCON-35,BALZIVA,BRIELLYN) 0.4-35 MG-MCG tablet Take 1 tablet by mouth daily.    Marland Kitchen. oxyCODONE-acetaminophen (PERCOCET) 10-325 MG tablet Take 1 tablet by mouth every 6 (six) hours as needed for pain.    Marland Kitchen. venlafaxine XR (EFFEXOR-XR) 150 MG 24 hr capsule Take 150 mg by mouth daily with breakfast.    . zolpidem (AMBIEN) 10 MG tablet Take 10 mg by mouth at bedtime as needed for sleep.    Marland Kitchen. sulfamethoxazole-trimethoprim (BACTRIM DS) 800-160 MG tablet Take 1 tablet by mouth 2 (two) times daily. 20 tablet 0   No current facility-administered medications for this visit.     Review of Systems Review of Systems  Constitutional: Negative.   Respiratory: Negative.   Cardiovascular: Negative.     Blood pressure 130/80, pulse 82, temperature 98 F (36.7 C), temperature source Oral, resp. rate 14, height 5\' 3"  (1.6 m), weight 128 lb (58.1 kg).  Physical Exam Physical Exam  Constitutional: She is oriented to person, place, and time. She appears well-developed and well-nourished.  Genitourinary:  Genitourinary Comments: 8 x 13 cm area of erythremia and 2 areas  of induration and fluctuance right gluteal  Musculoskeletal:       Back:  Lymphadenopathy:       Right: Inguinal adenopathy present.       Left: No inguinal adenopathy present.  Neurological: She is alert and oriented to person, place, and time.  Skin: Skin is warm.  Psychiatric: Her behavior is normal.    Data Reviewed 12/29/2015 cultures of the left gluteal abscess showed staph aureus sensitive to sulfa.  Assessment     Right gluteal abscess.    Plan    While there is not significant fluctuance it was elected to attempt to drain the 2 most indurated areas. ChloraPrep was applied  to the skin followed by [name] spray and 10 mL of 0.5% [name] with 0.25% [name] with 1-200,000 of epinephrine. After 20 minutes the area was reprepped, ethyl chloride spray again applied and the stab incisions made over both areas. Minimal purulent drainage from the inferior wound. Culture obtained. Dry dressing applied.  The patient will continue to use local heat, will be placed on Bactrim DS one by mouth twice a day. She has analgesics at home from her recent surgery.  Follow-up exam in 5 days.    Septra DS Culture obtained  This information has been scribed by Dorathy Daft RN, BSN,BC.  Earline Mayotte 04/06/2016, 4:24 PM

## 2016-04-06 NOTE — Telephone Encounter (Signed)
Patient called to let us know she has a right gluteal abscess. The buttock is red swollen and painful. She has a history of a left gluteal abscess drained back in July 2017.

## 2016-04-11 ENCOUNTER — Encounter: Payer: Self-pay | Admitting: General Surgery

## 2016-04-11 ENCOUNTER — Ambulatory Visit (INDEPENDENT_AMBULATORY_CARE_PROVIDER_SITE_OTHER): Payer: BLUE CROSS/BLUE SHIELD | Admitting: General Surgery

## 2016-04-11 VITALS — BP 138/70 | HR 98 | Temp 99.0°F | Resp 14 | Ht 63.0 in | Wt 132.0 lb

## 2016-04-11 DIAGNOSIS — L0231 Cutaneous abscess of buttock: Secondary | ICD-10-CM | POA: Diagnosis not present

## 2016-04-11 LAB — GLUCOSE, POCT (MANUAL RESULT ENTRY): POC GLUCOSE: 109 mg/dL — AB (ref 70–99)

## 2016-04-11 NOTE — Patient Instructions (Addendum)
The patient is aware to call back for any questions or concerns.   Complete antibiotics  Continue to use heat to the area for comfort.

## 2016-04-11 NOTE — Progress Notes (Signed)
Patient ID: Regina Aguilar, female   DOB: Nov 04, 1975, 40 y.o.   MRN: [id number]  Chief Complaint  Patient presents with  . Follow-up    abscess    HPI Regina Aguilar is a 40 y.o. female.  Here today for follow up right buttock abscess. She states it had gotten better iuntil yesterday it seemed to a little more red. She does admit to chills.   HPI  Past Medical History:  Diagnosis Date  . Anxiety   . Asthma    only with resp infection  . IBS (irritable bowel syndrome)   . PONV (postoperative nausea and vomiting)     Past Surgical History:  Procedure Laterality Date  . CESAREAN SECTION    . COLONOSCOPY  03/08/2016   [facility]  . DIAGNOSTIC LAPAROSCOPY  2008  . LAPAROSCOPIC ENDOMETRIOSIS FULGURATION  03/13/2016  . LAPAROSCOPY  01/24/2012   Procedure: LAPAROSCOPY OPERATIVE;  Surgeon: Jeani Hawking, MD;  Location: [facility];  Service: Gynecology;  Laterality: N/A;  Fulgeration of endometriosis  . NASAL TURBINATE REDUCTION      No family history on file.  Social History Social History  Substance Use Topics  . Smoking status: Former Games developer  . Smokeless tobacco: Never Used  . Alcohol use Yes     Comment: rarely    Allergies  Allergen Reactions  . Prednisone Swelling    When combined with Penicillin    Current Outpatient Prescriptions  Medication Sig Dispense Refill  . albuterol (PROVENTIL HFA;VENTOLIN HFA) 108 (90 BASE) MCG/ACT inhaler Inhale 2 puffs into the lungs every 6 (six) hours as needed. For shortness of breath or wheezing    . ALPRAZolam (XANAX) 0.5 MG tablet Take 0.5 mg by mouth daily as needed. For anxiety    . cyclobenzaprine (FLEXERIL) 5 MG tablet Take 5 mg by mouth as needed for muscle spasms.    . fluticasone (FLONASE) 50 MCG/ACT nasal spray Place 1 spray into both nostrils daily.    . fluticasone furoate-vilanterol (BREO ELLIPTA) 100-25 MCG/INH AEPB Inhale 1 puff into the lungs daily.    . Linaclotide (LINZESS PO) Take by mouth  daily.    . montelukast (SINGULAIR) 10 MG tablet Take 10 mg by mouth at bedtime.    . norethindrone-ethinyl estradiol (OVCON-35,BALZIVA,BRIELLYN) 0.4-35 MG-MCG tablet Take 1 tablet by mouth daily.    Marland Kitchen oxyCODONE-acetaminophen (PERCOCET) 10-325 MG tablet Take 1 tablet by mouth every 6 (six) hours as needed for pain.    Marland Kitchen sulfamethoxazole-trimethoprim (BACTRIM DS) 800-160 MG tablet Take 1 tablet by mouth 2 (two) times daily. 20 tablet 0  . venlafaxine XR (EFFEXOR-XR) 150 MG 24 hr capsule Take 150 mg by mouth daily with breakfast.    . zolpidem (AMBIEN) 10 MG tablet Take 10 mg by mouth at bedtime as needed for sleep.     No current facility-administered medications for this visit.     Review of Systems Review of Systems  Constitutional: Positive for chills.  Respiratory: Negative.   Cardiovascular: Negative.     Blood pressure 138/70, pulse 98, temperature 99 F (37.2 C), temperature source Oral, resp. rate 14, height 5\' 3"  (1.6 m), weight 132 lb (59.9 kg).  Physical Exam Physical Exam  Constitutional: She is oriented to person, place, and time. She appears well-developed and well-nourished.  Musculoskeletal:       Back:  Neurological: She is alert and oriented to person, place, and time.  Skin: Skin is warm and dry.  Psychiatric: Her behavior is normal.  Data Reviewed MSSA sensitive to Bactrim.  Blood sugar 1 hour after I lunch including posture: 109.  Assessment    Resolving gluteal abscess, no evidence of deep extension.  Appropriate antibiotic coverage.    Plan          FSBS 109 post pasta. Complete antibiotics  Continue to use heat to the area for comfort. The patient is aware to call back for any questions or concerns. Follow up as needed.   This information has been scribed by Dorathy DaftMarsha Hatch RN, BSN,BC.   Earline MayotteByrnett, Redford Behrle W 04/11/2016, 4:59 PM

## 2016-04-12 LAB — ANAEROBIC AND AEROBIC CULTURE

## 2016-12-05 ENCOUNTER — Other Ambulatory Visit: Payer: Self-pay | Admitting: Gastroenterology

## 2016-12-05 ENCOUNTER — Ambulatory Visit
Admission: RE | Admit: 2016-12-05 | Discharge: 2016-12-05 | Disposition: A | Discharge status: Home / Self Care | Payer: BLUE CROSS/BLUE SHIELD | Source: Ambulatory Visit | Attending: Gastroenterology | Admitting: Gastroenterology

## 2016-12-05 DIAGNOSIS — R1032 Left lower quadrant pain: Secondary | ICD-10-CM

## 2016-12-05 MED ORDER — IOPAMIDOL (ISOVUE-300) INJECTION 61%
100.0000 mL | Freq: Once | INTRAVENOUS | Status: AC | PRN
  Administered 2016-12-05: 100 mL via INTRAVENOUS

## 2017-02-27 DIAGNOSIS — S93409A Sprain of unspecified ligament of unspecified ankle, initial encounter: Secondary | ICD-10-CM | POA: Insufficient documentation

## 2017-09-10 ENCOUNTER — Emergency Department (HOSPITAL_COMMUNITY): Payer: BLUE CROSS/BLUE SHIELD

## 2017-09-10 ENCOUNTER — Emergency Department (HOSPITAL_COMMUNITY)
Admission: EM | Admit: 2017-09-10 | Discharge: 2017-09-10 | Disposition: A | Discharge status: Home / Self Care | Payer: BLUE CROSS/BLUE SHIELD | Attending: Emergency Medicine | Admitting: Emergency Medicine

## 2017-09-10 ENCOUNTER — Encounter (HOSPITAL_COMMUNITY): Payer: Self-pay

## 2017-09-10 DIAGNOSIS — R197 Diarrhea, unspecified: Secondary | ICD-10-CM | POA: Diagnosis not present

## 2017-09-10 DIAGNOSIS — Z87891 Personal history of nicotine dependence: Secondary | ICD-10-CM | POA: Diagnosis not present

## 2017-09-10 DIAGNOSIS — Z79899 Other long term (current) drug therapy: Secondary | ICD-10-CM | POA: Diagnosis not present

## 2017-09-10 DIAGNOSIS — R1031 Right lower quadrant pain: Secondary | ICD-10-CM | POA: Diagnosis present

## 2017-09-10 DIAGNOSIS — K529 Noninfective gastroenteritis and colitis, unspecified: Secondary | ICD-10-CM | POA: Diagnosis not present

## 2017-09-10 LAB — BASIC METABOLIC PANEL
Anion gap: 6 (ref 5–15)
BUN: 8 mg/dL (ref 6–20)
CALCIUM: 8.1 mg/dL — AB (ref 8.9–10.3)
CO2: 24 mmol/L (ref 22–32)
CREATININE: 0.51 mg/dL (ref 0.44–1.00)
Chloride: 110 mmol/L (ref 101–111)
GFR calc non Af Amer: >60 mL/min (ref >60)
Glucose, Bld: 84 mg/dL (ref 65–99)
Potassium: 3.6 mmol/L (ref 3.5–5.1)
Sodium: 140 mmol/L (ref 135–145)

## 2017-09-10 LAB — CBC
HCT: 38 % (ref 36.0–46.0)
Hemoglobin: 12.7 g/dL (ref 12.0–15.0)
MCH: 32 pg (ref 26.0–34.0)
MCHC: 33.4 g/dL (ref 30.0–36.0)
MCV: 95.7 fL (ref 78.0–100.0)
PLATELETS: 254 10*3/uL (ref 150–400)
RBC: 3.97 MIL/uL (ref 3.87–5.11)
RDW: 13.2 % (ref 11.5–15.5)
WBC: 4.6 10*3/uL (ref 4.0–10.5)

## 2017-09-10 LAB — URINALYSIS, ROUTINE W REFLEX MICROSCOPIC
Bilirubin Urine: NEGATIVE
Glucose, UA: NEGATIVE mg/dL
Hgb urine dipstick: NEGATIVE
KETONES UR: NEGATIVE mg/dL
LEUKOCYTES UA: NEGATIVE
Nitrite: NEGATIVE
PROTEIN: NEGATIVE mg/dL
Specific Gravity, Urine: 1.01 (ref 1.005–1.030)
pH: 7 (ref 5.0–8.0)

## 2017-09-10 LAB — I-STAT BETA HCG BLOOD, ED (MC, WL, AP ONLY)

## 2017-09-10 MED ORDER — DICYCLOMINE HCL 10 MG PO CAPS: 20.0000 mg | ORAL_CAPSULE | Freq: Four times a day (QID) | ORAL | 0 refills | Status: AC | PRN

## 2017-09-10 MED ORDER — IOPAMIDOL (ISOVUE-300) INJECTION 61%
INTRAVENOUS | Status: AC
  Filled 2017-09-10: qty 100

## 2017-09-10 MED ORDER — MORPHINE SULFATE (PF) 4 MG/ML IV SOLN
4.0000 mg | Freq: Once | INTRAVENOUS | Status: AC
Start: 2017-09-10 — End: 2017-09-10
  Administered 2017-09-10: 4 mg via INTRAVENOUS
  Filled 2017-09-10: qty 1

## 2017-09-10 MED ORDER — FENTANYL CITRATE (PF) 100 MCG/2ML IJ SOLN
50.0000 ug | INTRAMUSCULAR | Status: DC | PRN
  Administered 2017-09-10: 50 ug via INTRAVENOUS
  Filled 2017-09-10: qty 2

## 2017-09-10 MED ORDER — IOPAMIDOL (ISOVUE-300) INJECTION 61%
100.0000 mL | Freq: Once | INTRAVENOUS | Status: AC | PRN
  Administered 2017-09-10: 100 mL via INTRAVENOUS

## 2017-09-10 MED ORDER — ONDANSETRON HCL 4 MG/2ML IJ SOLN
4.0000 mg | Freq: Once | INTRAMUSCULAR | Status: AC
  Administered 2017-09-10: 4 mg via INTRAVENOUS
  Filled 2017-09-10: qty 2

## 2017-09-10 MED ORDER — SODIUM CHLORIDE 0.9 % IJ SOLN
INTRAMUSCULAR | Status: AC
  Filled 2017-09-10: qty 50

## 2017-09-10 NOTE — ED Provider Notes (Signed)
[facility]-EMERGENCY DEPT Provider Note   CSN: [id number] Arrival date & time: 09/10/17  0226     History   Chief Complaint Chief Complaint  Patient presents with  . Flank Pain    HPI  Blood pressure 115/78, pulse 97, temperature 98.4 F (36.9 C), temperature source Oral, resp. rate 20, last menstrual period 08/20/2017, SpO2 98 %.  Regina Aguilar is a 42 y.o. female complaining of diarrhea and right-sided abdominal pain with low-grade fever onset today.  No vomiting but she states that she feels nauseous.  No sick contacts with GI issues.  She denies any melena, hematochezia, no urinary symptoms or vaginal discharge.    Past Medical History:  Diagnosis Date  . Anxiety   . Asthma    only with resp infection  . IBS (irritable bowel syndrome)   . PONV (postoperative nausea and vomiting)     Patient Active Problem List   Diagnosis Date Noted  . Abscess, gluteal, right 04/06/2016    Past Surgical History:  Procedure Laterality Date  . CESAREAN SECTION    . COLONOSCOPY  03/08/2016   [facility]  . DIAGNOSTIC LAPAROSCOPY  2008  . LAPAROSCOPIC ENDOMETRIOSIS FULGURATION  03/13/2016  . LAPAROSCOPY  01/24/2012   Procedure: LAPAROSCOPY OPERATIVE;  Surgeon: Jeani HawkingMichelle L Grewal, MD;  Location: [facility];  Service: Gynecology;  Laterality: N/A;  Fulgeration of endometriosis  . NASAL TURBINATE REDUCTION      OB History    Gravida Para Term Preterm AB Living   1 1           SAB TAB Ectopic Multiple Live Births                   Home Medications    Prior to Admission medications   Medication Sig Start Date End Date Taking? Authorizing Provider  albuterol (PROVENTIL HFA;VENTOLIN HFA) 108 (90 BASE) MCG/ACT inhaler Inhale 2 puffs into the lungs every 6 (six) hours as needed. For shortness of breath or wheezing    [provider]  ALPRAZolam (XANAX) 0.5 MG tablet Take 0.5 mg by mouth daily as needed. For anxiety    [provider]    cyclobenzaprine (FLEXERIL) 5 MG tablet Take 5 mg by mouth as needed for muscle spasms.    [provider]  dicyclomine (BENTYL) 10 MG capsule Take 2 capsules (20 mg total) by mouth 4 (four) times daily as needed for spasms. 09/10/17   Elyas Villamor, Joni ReiningNicole, PA-C  fluticasone (FLONASE) 50 MCG/ACT nasal spray Place 1 spray into both nostrils daily.    [provider]  fluticasone furoate-vilanterol (BREO ELLIPTA) 100-25 MCG/INH AEPB Inhale 1 puff into the lungs daily.    [provider]  Linaclotide (LINZESS PO) Take by mouth daily.    [provider]  montelukast (SINGULAIR) 10 MG tablet Take 10 mg by mouth at bedtime.    [provider]  norethindrone-ethinyl estradiol (OVCON-35,BALZIVA,BRIELLYN) 0.4-35 MG-MCG tablet Take 1 tablet by mouth daily.    [provider]  oxyCODONE-acetaminophen (PERCOCET) 10-325 MG tablet Take 1 tablet by mouth every 6 (six) hours as needed for pain.    [provider]  sulfamethoxazole-trimethoprim (BACTRIM DS) 800-160 MG tablet Take 1 tablet by mouth 2 (two) times daily. 04/06/16   Earline MayotteByrnett, Jeffrey W, MD  venlafaxine XR (EFFEXOR-XR) 150 MG 24 hr capsule Take 150 mg by mouth daily with breakfast.    [provider]  zolpidem (AMBIEN) 10 MG tablet Take 10 mg by  mouth at bedtime as needed for sleep.    [provider]    Family History History reviewed. No pertinent family history.  Social History Social History   Tobacco Use  . Smoking status: Former Games developer  . Smokeless tobacco: Never Used  Substance Use Topics  . Alcohol use: Yes    Comment: rarely  . Drug use: No     Allergies   Prednisone   Review of Systems Review of Systems  A complete review of systems was obtained and all systems are negative except as noted in the HPI and PMH.   Physical Exam Updated Vital Signs BP 98/65 (BP Location: Left Arm)   Pulse 74   Temp 98.4 F (36.9 C) (Oral)   Resp 16   LMP  08/20/2017 Comment: negative beta HCG 09/10/17  SpO2 100%   Physical Exam  Constitutional: She is oriented to person, place, and time. She appears well-developed and well-nourished. No distress.  HENT:  Head: Normocephalic and atraumatic.  Mouth/Throat: Oropharynx is clear and moist.  Eyes: Conjunctivae and EOM are normal. Pupils are equal, round, and reactive to light.  Neck: Normal range of motion.  Cardiovascular: Normal rate, regular rhythm and intact distal pulses.  Pulmonary/Chest: Effort normal and breath sounds normal.  Abdominal: Soft. There is tenderness.  Mild tenderness around the umbilicus, no tenderness over McBurney's point, Rovsing, psoas and obturator are negative.  Musculoskeletal: Normal range of motion.  Neurological: She is alert and oriented to person, place, and time.  Skin: She is not diaphoretic.  Psychiatric: She has a normal mood and affect.  Nursing note and vitals reviewed.    ED Treatments / Results  Labs (all labs ordered are listed, but only abnormal results are displayed) Labs Reviewed  BASIC METABOLIC PANEL - Abnormal; Notable for the following components:      Result Value   Calcium 8.1 (*)    All other components within normal limits  URINE CULTURE  URINALYSIS, ROUTINE W REFLEX MICROSCOPIC  CBC  I-STAT BETA HCG BLOOD, ED ([facility], [facility], [facility] ONLY)    EKG  EKG Interpretation None       Radiology Ct Abdomen Pelvis W Contrast  Result Date: 09/10/2017 CLINICAL DATA:  42 y/o F; severe right flank pain and diarrhea for 1 day. History of IBS, endometriosis, and C-section. EXAM: CT ABDOMEN AND PELVIS WITH CONTRAST TECHNIQUE: Multidetector CT imaging of the abdomen and pelvis was performed using the standard protocol following bolus administration of intravenous contrast. CONTRAST:  ISOVUE-300 IOPAMIDOL (ISOVUE-300) INJECTION 61% COMPARISON:  12/05/2016 and 12/13/2010 CT abdomen and pelvis. FINDINGS: Lower chest: No acute abnormality.  Hepatobiliary: No focal liver abnormality is seen. No gallstones, gallbladder wall thickening, or biliary dilatation. Pancreas: Stable 7 mm cyst within the pancreatic body (series 2, image 20 and series 6, image 58) from 2012. No inflammatory change or main duct dilatation. Spleen: Normal in size without focal abnormality. Adrenals/Urinary Tract: Adrenal glands are unremarkable. Kidneys are normal, without renal calculi, focal lesion, or hydronephrosis. Bladder is unremarkable. Stomach/Bowel: Stomach is within normal limits. Appendix appears normal. No evidence of bowel wall thickening, distention, or inflammatory changes. Vascular/Lymphatic: No significant vascular findings are present. No enlarged abdominal or pelvic lymph nodes. Reproductive: Uterus and bilateral adnexa are unremarkable. Other: No abdominal wall hernia or abnormality. No abdominopelvic ascites. Musculoskeletal: Right chronic L5 pars defects. No acute fracture identified. IMPRESSION: 1. No acute process identified. 2. 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 This recommendation follows ACR consensus guidelines: Management of Incidental Pancreatic Cysts: A White Paper of the ACR Incidental Findings Committee. J Am Coll Radiol 2017;14:911-923. 3. Right chronic L5 pars defect. Electronically Signed   By: Mitzi Hansen M.D.   On: 09/10/2017 06:00    Procedures Procedures (including critical care time)  Medications Ordered in ED Medications  fentaNYL (SUBLIMAZE) injection 50 mcg (50 mcg Intravenous Given 09/10/17 0357)  iopamidol (ISOVUE-300) 61 % injection (not administered)  sodium chloride 0.9 % injection (not administered)  morphine 4 MG/ML injection 4 mg (4 mg Intravenous Given 09/10/17 0527)  ondansetron (ZOFRAN) injection 4 mg (4 mg Intravenous Given 09/10/17 0526)  iopamidol (ISOVUE-300) 61 % injection 100 mL (100 mLs  Intravenous Contrast Given 09/10/17 0531)     Initial Impression / Assessment and Plan / ED Course  I have reviewed the triage vital signs and the nursing notes.  Pertinent labs & imaging results that were available during my care of the patient were reviewed by me and considered in my medical decision making (see chart for details).     Vitals:   09/10/17 0241 09/10/17 0606  BP: 115/78 98/65  Pulse: 97 74  Resp: 20 16  Temp: 98.4 F (36.9 C)   TempSrc: Oral   SpO2: 98% 100%    Medications  fentaNYL (SUBLIMAZE) injection 50 mcg (50 mcg Intravenous Given 09/10/17 0357)  iopamidol (ISOVUE-300) 61 % injection (not administered)  sodium chloride 0.9 % injection (not administered)  morphine 4 MG/ML injection 4 mg (4 mg Intravenous Given 09/10/17 0527)  ondansetron (ZOFRAN) injection 4 mg (4 mg Intravenous Given 09/10/17 0526)  iopamidol (ISOVUE-300) 61 % injection 100 mL (100 mLs Intravenous Contrast Given 09/10/17 0531)    TIEARRA COLWELL is 42 y.o. female presenting with diarrhea and right-sided abdominal pain.  Abdominal exam is nonsurgical, blood work reassuring, will obtain CT given the right-sided nature of her pain.  CT without acute findings, they do note a pancreatic cyst which is unchanged from 2012, I discussed this with the patient who was not aware, she understands that she needs to follow with her primary care on this and have another imaging study in 2 years.  Patient given the lack of pathology I have refused to write this patient narcotics.  Bentyl will be given at discharge.  Evaluation does not show pathology that would require ongoing emergent intervention or inpatient treatment. Pt is hemodynamically stable and mentating appropriately. Discussed findings and plan with patient/guardian, who agrees with care plan. All questions answered. Return precautions discussed and outpatient follow up given.      Final Clinical Impressions(s) / ED Diagnoses   Final  diagnoses:  Enteritis    ED Discharge Orders        Ordered    dicyclomine (BENTYL) 10 MG capsule  4 times daily PRN     09/10/17 0606       Kalman Nylen, Mardella Layman 09/10/17 0617    Molpus, Jonny Ruiz, MD 09/10/17 [phone]

## 2017-09-10 NOTE — ED Notes (Signed)
Patient transported to CT 

## 2017-09-10 NOTE — ED Triage Notes (Signed)
Pt states that she hasn't felt good in a few weeks, mainly URI symptoms Today patient states that she started having severe right side pain  Pt also says that she's had diarrhea all day

## 2017-09-10 NOTE — Discharge Instructions (Signed)
Please follow with your primary care doctor in the next 2 days for a check-up. They must obtain records for further management.  ° °Do not hesitate to return to the Emergency Department for any new, worsening or concerning symptoms.  ° °

## 2017-09-11 ENCOUNTER — Telehealth: Payer: Self-pay

## 2017-09-11 ENCOUNTER — Encounter: Payer: Self-pay | Admitting: Internal Medicine

## 2017-09-11 ENCOUNTER — Ambulatory Visit: Payer: BLUE CROSS/BLUE SHIELD | Admitting: Internal Medicine

## 2017-09-11 ENCOUNTER — Emergency Department: Payer: BLUE CROSS/BLUE SHIELD

## 2017-09-11 ENCOUNTER — Encounter: Payer: Self-pay | Admitting: Emergency Medicine

## 2017-09-11 ENCOUNTER — Emergency Department
Admission: EM | Admit: 2017-09-11 | Discharge: 2017-09-11 | Disposition: A | Discharge status: Home / Self Care | Payer: BLUE CROSS/BLUE SHIELD | Attending: Emergency Medicine | Admitting: Emergency Medicine

## 2017-09-11 ENCOUNTER — Other Ambulatory Visit: Payer: Self-pay

## 2017-09-11 VITALS — BP 103/69 | Temp 97.7°F | Resp 16 | Ht 63.0 in | Wt 121.4 lb

## 2017-09-11 DIAGNOSIS — J45909 Unspecified asthma, uncomplicated: Secondary | ICD-10-CM | POA: Diagnosis not present

## 2017-09-11 DIAGNOSIS — R1031 Right lower quadrant pain: Secondary | ICD-10-CM | POA: Diagnosis not present

## 2017-09-11 DIAGNOSIS — Z79899 Other long term (current) drug therapy: Secondary | ICD-10-CM | POA: Diagnosis not present

## 2017-09-11 DIAGNOSIS — Z87891 Personal history of nicotine dependence: Secondary | ICD-10-CM | POA: Diagnosis not present

## 2017-09-11 HISTORY — DX: Ventral hernia without obstruction or gangrene: K43.9

## 2017-09-11 LAB — CBC
HCT: 39.3 % (ref 35.0–47.0)
Hemoglobin: 13.1 g/dL (ref 12.0–16.0)
MCH: 31.7 pg (ref 26.0–34.0)
MCHC: 33.3 g/dL (ref 32.0–36.0)
MCV: 95.1 fL (ref 80.0–100.0)
Platelets: 247 10*3/uL (ref 150–440)
RBC: 4.13 MIL/uL (ref 3.80–5.20)
RDW: 13.3 % (ref 11.5–14.5)
WBC: 6.9 10*3/uL (ref 3.6–11.0)

## 2017-09-11 LAB — URINE CULTURE: CULTURE: NO GROWTH

## 2017-09-11 LAB — COMPREHENSIVE METABOLIC PANEL
ALBUMIN: 4.2 g/dL (ref 3.5–5.0)
ALK PHOS: 48 U/L (ref 38–126)
ALT: 10 U/L — ABNORMAL LOW (ref 14–54)
AST: 13 U/L — AB (ref 15–41)
Anion gap: 7 (ref 5–15)
BILIRUBIN TOTAL: 0.3 mg/dL (ref 0.3–1.2)
BUN: 7 mg/dL (ref 6–20)
CO2: 27 mmol/L (ref 22–32)
Calcium: 8.7 mg/dL — ABNORMAL LOW (ref 8.9–10.3)
Chloride: 99 mmol/L — ABNORMAL LOW (ref 101–111)
Creatinine, Ser: 0.55 mg/dL (ref 0.44–1.00)
GFR calc Af Amer: >60 mL/min (ref >60)
GFR calc non Af Amer: >60 mL/min (ref >60)
GLUCOSE: 114 mg/dL — AB (ref 65–99)
POTASSIUM: 3.8 mmol/L (ref 3.5–5.1)
SODIUM: 133 mmol/L — AB (ref 135–145)
TOTAL PROTEIN: 7.5 g/dL (ref 6.5–8.1)

## 2017-09-11 MED ORDER — ONDANSETRON HCL 4 MG/2ML IJ SOLN
4.0000 mg | Freq: Once | INTRAMUSCULAR | Status: AC
  Administered 2017-09-11: 4 mg via INTRAVENOUS

## 2017-09-11 MED ORDER — ONDANSETRON HCL 4 MG/2ML IJ SOLN
INTRAMUSCULAR | Status: AC
  Filled 2017-09-11: qty 2

## 2017-09-11 MED ORDER — METRONIDAZOLE 500 MG PO TABS: 500.0000 mg | ORAL_TABLET | Freq: Three times a day (TID) | ORAL | 0 refills | Status: AC

## 2017-09-11 MED ORDER — ONDANSETRON 4 MG PO TBDP: 4.0000 mg | ORAL_TABLET | Freq: Three times a day (TID) | ORAL | 0 refills | Status: AC | PRN

## 2017-09-11 MED ORDER — MORPHINE SULFATE (PF) 4 MG/ML IV SOLN
INTRAVENOUS | Status: AC
  Filled 2017-09-11: qty 1

## 2017-09-11 MED ORDER — NAPROXEN 500 MG PO TABS: 500.0000 mg | ORAL_TABLET | Freq: Two times a day (BID) | ORAL | 0 refills | Status: AC

## 2017-09-11 MED ORDER — CEFDINIR 300 MG PO CAPS: 300.0000 mg | ORAL_CAPSULE | Freq: Two times a day (BID) | ORAL | 0 refills | Status: AC

## 2017-09-11 MED ORDER — SODIUM CHLORIDE 0.9 % IV BOLUS (SEPSIS)
1000.0000 mL | Freq: Once | INTRAVENOUS | Status: AC
  Administered 2017-09-11: 1000 mL via INTRAVENOUS

## 2017-09-11 MED ORDER — IOPAMIDOL (ISOVUE-300) INJECTION 61%
75.0000 mL | Freq: Once | INTRAVENOUS | Status: AC | PRN
  Administered 2017-09-11: 75 mL via INTRAVENOUS
  Filled 2017-09-11: qty 75

## 2017-09-11 MED ORDER — MORPHINE SULFATE (PF) 4 MG/ML IV SOLN
4.0000 mg | Freq: Once | INTRAVENOUS | Status: AC
  Administered 2017-09-11: 4 mg via INTRAVENOUS

## 2017-09-11 NOTE — Progress Notes (Signed)
[facility] [address] [place], [place] [address]  Internal MEDICINE  Office Visit Note  Patient Name: Regina Aguilar  [id number]  [id number]  Date of Service: 10/09/2017  Chief Complaint  Patient presents with  . right side abdominal pain    fever,diarrhea, nausea started sunday    HPI  Pt is here for sick visit with severe abdominal pain, bend over the exam room. Has been going on for 2 days. Yesterday she went to ED with same symptoms. Pt was sent home when CT scan of the abdomen was found to be normal    Current Medication: Outpatient Encounter Medications as of 09/11/2017  Medication Sig  . albuterol (PROVENTIL HFA;VENTOLIN HFA) 108 (90 BASE) MCG/ACT inhaler Inhale 2 puffs into the lungs every 6 (six) hours as needed. For shortness of breath or wheezing  . ALPRAZolam (XANAX) 0.5 MG tablet Take 0.5 mg by mouth daily as needed. For anxiety  . cyclobenzaprine (FLEXERIL) 5 MG tablet Take 5 mg by mouth as needed for muscle spasms.  Marland Kitchen dicyclomine (BENTYL) 10 MG capsule Take 2 capsules (20 mg total) by mouth 4 (four) times daily as needed for spasms.  . fluticasone (FLONASE) 50 MCG/ACT nasal spray Place 1 spray into both nostrils daily.  . fluticasone furoate-vilanterol (BREO ELLIPTA) 100-25 MCG/INH AEPB Inhale 1 puff into the lungs daily.  . Linaclotide (LINZESS PO) Take by mouth daily.  . montelukast (SINGULAIR) 10 MG tablet Take 10 mg by mouth at bedtime.  . norethindrone-ethinyl estradiol (OVCON-35,BALZIVA,BRIELLYN) 0.4-35 MG-MCG tablet Take 1 tablet by mouth daily.  Marland Kitchen oxyCODONE-acetaminophen (PERCOCET) 10-325 MG tablet Take 1 tablet by mouth every 6 (six) hours as needed for pain.  Marland Kitchen sulfamethoxazole-trimethoprim (BACTRIM DS) 800-160 MG tablet Take 1 tablet by mouth 2 (two) times daily.  Marland Kitchen venlafaxine XR (EFFEXOR-XR) 150 MG 24 hr capsule Take 150 mg by mouth daily with breakfast.  . zolpidem (AMBIEN) 10 MG tablet Take 10 mg by mouth at bedtime as needed for  sleep.   No facility-administered encounter medications on file as of 09/11/2017.     Surgical History: Past Surgical History:  Procedure Laterality Date  . CESAREAN SECTION    . COLONOSCOPY  03/08/2016   [facility]  . DIAGNOSTIC LAPAROSCOPY  2008  . LAPAROSCOPIC ENDOMETRIOSIS FULGURATION  03/13/2016  . LAPAROSCOPY  01/24/2012   Procedure: LAPAROSCOPY OPERATIVE;  Surgeon: Jeani Hawking, MD;  Location: [facility];  Service: Gynecology;  Laterality: N/A;  Fulgeration of endometriosis  . NASAL TURBINATE REDUCTION      Medical History: Past Medical History:  Diagnosis Date  . Anxiety   . Asthma    only with resp infection  . IBS (irritable bowel syndrome)   . PONV (postoperative nausea and vomiting)   . Ventral hernia     Family History: History reviewed. No pertinent family history.  Social History   Socioeconomic History  . Marital status: Single    Spouse name: Not on file  . Number of children: Not on file  . Years of education: Not on file  . Highest education level: Not on file  Occupational History  . Not on file  Social Needs  . Financial resource strain: Not on file  . Food insecurity:    Worry: Not on file    Inability: Not on file  . Transportation needs:    Medical: Not on file    Non-medical: Not on file  Tobacco Use  . Smoking status: Former Games developer  . Smokeless  tobacco: Never Used  Substance and Sexual Activity  . Alcohol use: Yes    Comment: rarely  . Drug use: No  . Sexual activity: Not on file  Lifestyle  . Physical activity:    Days per week: Not on file    Minutes per session: Not on file  . Stress: Not on file  Relationships  . Social connections:    Talks on phone: Not on file    Gets together: Not on file    Attends religious service: Not on file    Active member of club or organization: Not on file    Attends meetings of clubs or organizations: Not on file    Relationship status: Not on file  . Intimate partner violence:     Fear of current or ex partner: Not on file    Emotionally abused: Not on file    Physically abused: Not on file    Forced sexual activity: Not on file  Other Topics Concern  . Not on file  Social History Narrative  . Not on file      Review of Systems  Constitutional: Positive for chills and fever.  Gastrointestinal: Positive for abdominal pain and diarrhea. Negative for constipation, nausea and vomiting.  Genitourinary: Positive for dysuria and flank pain.  Musculoskeletal: Negative for back pain and neck pain.  Skin: Negative for color change.  Psychiatric/Behavioral: Behavioral problem: depression.   Vital Signs: BP 103/69 (BP Location: Left Arm, Patient Position: Sitting)   Temp 97.7 F (36.5 C)   Resp 16   Ht 5\' 3"  (1.6 m)   Wt 121 lb 6.4 oz (55.1 kg)   LMP 08/20/2017 Comment: negtive beta HCG 09/10/17  SpO2 99%   BMI 21.51 kg/m   Physical Exam  Constitutional: She appears well-developed. She appears distressed.  Eyes: Pupils are equal, round, and reactive to light. EOM are normal.  Abdominal: She exhibits no distension. There is tenderness.    Assessment/Plan: 1. Acute abdominal pain in right lower quadrant Pt is sent to ED for further evaluation and treatment  General Counseling: Judeth CornfieldStephanie verbalizes understanding of the findings of todays visit and agrees with plan of treatment. I have discussed any further diagnostic evaluation that may be needed or ordered today. We also reviewed her medications today. she has been encouraged to call the office with any questions or concerns that should arise related to todays visit.  Time spent:15 Minutes  Dr Lyndon CodeFozia M Khan Internal medicine

## 2017-09-11 NOTE — ED Provider Notes (Signed)
[facility] Emergency Department Provider Note  ____________________________________________  Time seen: Approximately 3:23 PM  I have reviewed the triage vital signs and the nursing notes.   HISTORY  Chief Complaint Abdominal Pain    HPI Regina Aguilar is a 42 y.o. female who complains of worsening abdominal pain for the past 3-4 days. Started at the umbilicus, migrated to the right lower quadrant. Associated fever to 101 at home. Positive chills. No aggravating or alleviating factors. Moderate to severe, waxing and waning. Constant.  She was seen at an outside hospital about 36 hours ago, had a CT scan which was normal at that time. She followed up with her primary care doctor today who sent her to the ED for repeat evaluation for suspected appendicitis. She took ibuprofen and Tylenol this morning which may be masking her fever.  I reviewed the CT scan and labs from outside hospital, found them unremarkable. No evidence of ovarian cyst.  Patient denies any recent sexual activity, denies any vaginal bleeding discomfort or discharge. She has no suspicion of an STI at this time.     Past Medical History:  Diagnosis Date  . Anxiety   . Asthma    only with resp infection  . IBS (irritable bowel syndrome)   . PONV (postoperative nausea and vomiting)   . Ventral hernia      Patient Active Problem List   Diagnosis Date Noted  . Sprain of ankle 02/27/2017  . Abscess, gluteal, right 04/06/2016  . Biceps tendinitis 09/20/2015  . Chronic pain syndrome 09/08/2014  . Neuropathic pain of right lower extremity 09/08/2014  . Pain of right lower extremity 09/01/2014     Past Surgical History:  Procedure Laterality Date  . CESAREAN SECTION    . COLONOSCOPY  03/08/2016   [facility]  . DIAGNOSTIC LAPAROSCOPY  2008  . LAPAROSCOPIC ENDOMETRIOSIS FULGURATION  03/13/2016  . LAPAROSCOPY  01/24/2012   Procedure: LAPAROSCOPY OPERATIVE;  Surgeon:  Jeani HawkingMichelle L Grewal, MD;  Location: [facility];  Service: Gynecology;  Laterality: N/A;  Fulgeration of endometriosis  . NASAL TURBINATE REDUCTION       Prior to Admission medications   Medication Sig Start Date End Date Taking? Authorizing Provider  albuterol (PROVENTIL HFA;VENTOLIN HFA) 108 (90 BASE) MCG/ACT inhaler Inhale 2 puffs into the lungs every 6 (six) hours as needed. For shortness of breath or wheezing    [provider]  ALPRAZolam (XANAX) 0.5 MG tablet Take 0.5 mg by mouth daily as needed. For anxiety    [provider]  cefdinir (OMNICEF) 300 MG capsule Take 1 capsule (300 mg total) by mouth 2 (two) times daily. 09/11/17   Sharman CheekStafford, Hester Forget, MD  cyclobenzaprine (FLEXERIL) 5 MG tablet Take 5 mg by mouth as needed for muscle spasms.    [provider]  dicyclomine (BENTYL) 10 MG capsule Take 2 capsules (20 mg total) by mouth 4 (four) times daily as needed for spasms. 09/10/17   Pisciotta, Joni ReiningNicole, PA-C  fluticasone (FLONASE) 50 MCG/ACT nasal spray Place 1 spray into both nostrils daily.    [provider]  fluticasone furoate-vilanterol (BREO ELLIPTA) 100-25 MCG/INH AEPB Inhale 1 puff into the lungs daily.    [provider]  Linaclotide (LINZESS PO) Take by mouth daily.    [provider]  metroNIDAZOLE (FLAGYL) 500 MG tablet Take 1 tablet (500 mg total) by mouth 3 (three) times daily. 09/11/17   Sharman CheekStafford, Derrious Bologna, MD  montelukast (SINGULAIR) 10 MG tablet Take 10 mg by  mouth at bedtime.    [provider]  naproxen (NAPROSYN) 500 MG tablet Take 1 tablet (500 mg total) by mouth 2 (two) times daily with a meal. 09/11/17   Sharman Cheek, MD  norethindrone-ethinyl estradiol (OVCON-35,BALZIVA,BRIELLYN) 0.4-35 MG-MCG tablet Take 1 tablet by mouth daily.    [provider]  ondansetron (ZOFRAN ODT) 4 MG disintegrating tablet Take 1 tablet (4 mg total) by mouth every 8 (eight) hours as needed for nausea or vomiting. 09/11/17    Sharman Cheek, MD  oxyCODONE-acetaminophen (PERCOCET) 10-325 MG tablet Take 1 tablet by mouth every 6 (six) hours as needed for pain.    [provider]  sulfamethoxazole-trimethoprim (BACTRIM DS) 800-160 MG tablet Take 1 tablet by mouth 2 (two) times daily. 04/06/16   Earline Mayotte, MD  venlafaxine XR (EFFEXOR-XR) 150 MG 24 hr capsule Take 150 mg by mouth daily with breakfast.    [provider]  zolpidem (AMBIEN) 10 MG tablet Take 10 mg by mouth at bedtime as needed for sleep.    [provider]     Allergies Prednisone   No family history on file.  Social History Social History   Tobacco Use  . Smoking status: Former Games developer  . Smokeless tobacco: Never Used  Substance Use Topics  . Alcohol use: Yes    Comment: rarely  . Drug use: No    Review of Systems  Constitutional:   Positive fever and chills.  ENT:   No sore throat. No rhinorrhea. Cardiovascular:   No chest pain or syncope. Respiratory:   No dyspnea or cough. Gastrointestinal:   Positive for abdominal pain without vomiting and diarrhea.  Musculoskeletal:   Negative for focal pain or swelling All other systems reviewed and are negative except as documented above in ROS and HPI.  ____________________________________________   PHYSICAL EXAM:  VITAL SIGNS: ED Triage Vitals  Enc Vitals Group     BP 09/11/17 1259 111/65     Pulse Rate 09/11/17 1259 93     Resp 09/11/17 1259 16     Temp 09/11/17 1259 98.2 F (36.8 C)     Temp Source 09/11/17 1259 Oral     SpO2 09/11/17 1259 98 %     Weight 09/11/17 1300 121 lb (54.9 kg)     Height 09/11/17 1300 5\' 3"  (1.6 m)     Head Circumference --      Peak Flow --      Pain Score 09/11/17 1259 8     Pain Loc --      Pain Edu? --      Excl. in GC? --     Vital signs reviewed, nursing assessments reviewed.   Constitutional:   Alert and oriented. Not in distress Eyes:   No scleral icterus.  EOMI. No nystagmus. No conjunctival  pallor. PERRL. ENT   Head:   Normocephalic and atraumatic.   Nose:   No congestion/rhinnorhea.    Mouth/Throat:   Dry mucous membranes, no pharyngeal erythema. No peritonsillar mass.    Neck:   No meningismus. Full ROM. Hematological/Lymphatic/Immunilogical:   No cervical lymphadenopathy. Cardiovascular:   RRR. Symmetric bilateral radial and DP pulses.  No murmurs.  Respiratory:   Normal respiratory effort without tachypnea/retractions. Breath sounds are clear and equal bilaterally. No wheezes/rales/rhonchi. Gastrointestinal:   Soft with focal right lower quadrant tenderness at McBurney's point. Non distended. There is no CVA tenderness.  No rebound, rigidity, or guarding. Genitourinary:   deferred Musculoskeletal:   Normal range of  motion in all extremities. No joint effusions.  No lower extremity tenderness.  No edema. Neurologic:   Normal speech and language.  Motor grossly intact. No acute focal neurologic deficits are appreciated.  Skin:    Skin is warm, dry and intact. No rash noted.  No petechiae, purpura, or bullae.  ____________________________________________    LABS (pertinent positives/negatives) (all labs ordered are listed, but only abnormal results are displayed) Labs Reviewed  COMPREHENSIVE METABOLIC PANEL - Abnormal; Notable for the following components:      Result Value   Sodium 133 (*)    Chloride 99 (*)    Glucose, Bld 114 (*)    Calcium 8.7 (*)    AST 13 (*)    ALT 10 (*)    All other components within normal limits  CBC   ____________________________________________   EKG    ____________________________________________    RADIOLOGY  Ct Abdomen Pelvis W Contrast  Result Date: 09/11/2017 CLINICAL DATA:  42 year old female with periumbilical pain radiating to the right abdomen for 3 days. Diarrhea and fever. EXAM: CT ABDOMEN AND PELVIS WITH CONTRAST TECHNIQUE: Multidetector CT imaging of the abdomen and pelvis was performed using the  standard protocol following bolus administration of intravenous contrast. CONTRAST:  75mL ISOVUE-300 IOPAMIDOL (ISOVUE-300) INJECTION 61% COMPARISON:  [facility] CT abdomen and Pelvis 09/10/2017, [facility] CT Abdomen and Pelvis 12/05/2016. FINDINGS: Lower chest: Negative lung bases. No pericardial or pleural effusion. Hepatobiliary: Mild very carious contrast excretion to the gallbladder. Negative liver and gallbladder. Pancreas: Stable pancreas, the small pancreatic body cyst described on 09/10/2017 is less apparent today (series 2, image 28). Spleen: Negative. Adrenals/Urinary Tract: Normal adrenal glands. Bilateral renal enhancement and contrast excretion is symmetric and normal. The proximal ureters are within normal limits. The distal ureters are difficult to delineate. Unremarkable urinary bladder. Tiny pelvic phleboliths are stable since 2018. Stomach/Bowel: Redundant sigmoid colon. Oral contrast has reached the mid to distal sigmoid. Oral contrast is mixed with retained stool in the left and transverse colon. Similar retained stool in the right colon. The appendix remains normal and is identified medial to the cecum on coronal image 31. No large bowel inflammation. Negative terminal ileum. Fluid-filled but nondilated small bowel loops in the pelvis and lower abdomen. No inflamed or obstructed small bowel is identified. Similar fluid-filled stomach. Negative duodenum. No abdominal free air.  No abdominal free fluid. Vascular/Lymphatic: Major arterial structures are patent. The portal venous system is patent. No lymphadenopathy. Reproductive: Negative. Other: Trace pelvic free fluid seen on the right side on series 2, image 72. Musculoskeletal: Chronically dysplastic right L5 posterior elements with spina bifida occulta and chronic or congenital pars fracture. No spondylolisthesis. No acute osseous abnormality identified. IMPRESSION: 1. The appendix remains normal. 2. Trace pelvic free fluid  is nonspecific but probably physiologic. No acute or inflammatory process is identified in the abdomen or pelvis. Electronically Signed   By: Odessa Fleming M.D.   On: 09/11/2017 15:13    ____________________________________________   PROCEDURES Procedures  ____________________________________________    CLINICAL IMPRESSION / ASSESSMENT AND PLAN / ED COURSE  Pertinent labs & imaging results that were available during my care of the patient were reviewed by me and considered in my medical decision making (see chart for details).   Patient presents with symptoms and exam consistent with appendicitis. Vital signs are normal, she is not in distress. At this point, I will repeat the CT scan. Now 3 or 4 days into symptoms, if she has appendicitis that  should really be evident on CT, as should any other inflammatory process. I doubt ovarian torsion TOA or PID. Patient is not pregnant. Her biggest concern is appendicitis versus inflammatory bowel disease versus colitis.  Clinical Course as of Sep 11 1699  Tue Sep 11, 2017  1519 CT again negative for appendicitis. Labs unremarkable  [PS]    Clinical Course User Index [PS] Sharman Cheek, MD     ----------------------------------------- 3:28 PM on 09/11/2017 -----------------------------------------  Given the negative workup in the setting of presentation that is fairly typical of appendicitis, I will plan to treat the patient with antibiotics. If she has clinical appendicitis with a radiographically non-inflamed and non-enlarged appendix, symptoms should be well managed with oral antibiotics preventing the need for delayed surgical intervention. I don't think she warrants admission for serial abdominal exams given that she has normal vital signs, afebrile in the ED over several hours, normal white blood cell count.  ____________________________________________   FINAL CLINICAL IMPRESSION(S) / ED DIAGNOSES    Final diagnoses:  Right  lower quadrant abdominal pain     ED Discharge Orders        Ordered    metroNIDAZOLE (FLAGYL) 500 MG tablet  3 times daily     09/11/17 1659    cefdinir (OMNICEF) 300 MG capsule  2 times daily     09/11/17 1659    ondansetron (ZOFRAN ODT) 4 MG disintegrating tablet  Every 8 hours PRN     09/11/17 1659    naproxen (NAPROSYN) 500 MG tablet  2 times daily with meals     09/11/17 1659      Portions of this note were generated with dragon dictation software. Dictation errors may occur despite best attempts at proofreading.    Sharman Cheek, MD 09/11/17 [phone]

## 2017-09-11 NOTE — ED Triage Notes (Signed)
Says she has pain at umbilicus that rad out to right side abd since sat.  Went to hosp on Sunday night.  Also diarrhea.  Not eating.  Fever.

## 2017-09-11 NOTE — ED Notes (Signed)
Pt made aware that she is unable to drive due to narcotics given and she will need to call for a ride home.  Pt will remain in room until ride is available.

## 2017-09-11 NOTE — Telephone Encounter (Signed)
Called [facility] Ed to advise them that we were sending a patient via car to be seen for possible appendicitis; spoke with head nurse Bill/ titania

## 2017-09-11 NOTE — Discharge Instructions (Signed)
Your labs and CT scan of the abdomen today were again unremarkable. Although there are no signs of appendicitis on the scan, your symptoms are suggestive of appendicitis, so take antibiotics (Cefdinir and metronidazole) as prescribed to prevent worsening intra-abdominal infection. Follow-up with your doctor within 1 week for continued monitoring of your symptoms.

## 2017-09-11 NOTE — ED Notes (Signed)
Patient transported to CT 

## 2017-09-13 ENCOUNTER — Ambulatory Visit: Payer: Self-pay | Admitting: Nurse Practitioner

## 2017-09-26 ENCOUNTER — Ambulatory Visit: Payer: Self-pay | Admitting: Internal Medicine

## 2017-12-31 DEATH — deceased

## 2018-06-03 IMAGING — CT CT ABD-PELV W/ CM
2 of 4 series · 15 of 46 positions shown, 17 images · IV contrast (ISOVUE)
Comparison: 12/05/2016 and 12/13/2010 CT abdomen and pelvis.

CLINICAL DATA: 41 y/o F; severe right flank pain and diarrhea for 1
day. History of IBS, endometriosis, and C-section.

EXAM:
CT ABDOMEN AND PELVIS WITH CONTRAST
TECHNIQUE: Multidetector CT imaging of the abdomen and pelvis was performed
using the standard protocol following bolus administration of
intravenous contrast.
CONTRAST:  100mL H5M3B4-LPP IOPAMIDOL (H5M3B4-LPP) INJECTION 61%

[Series 2: axial st · axial · 0.63mm/px · z∈[+1102,+1492]mm · 12 of 86 slices shown, 14 images]
[im 4/86  soft-tissue]
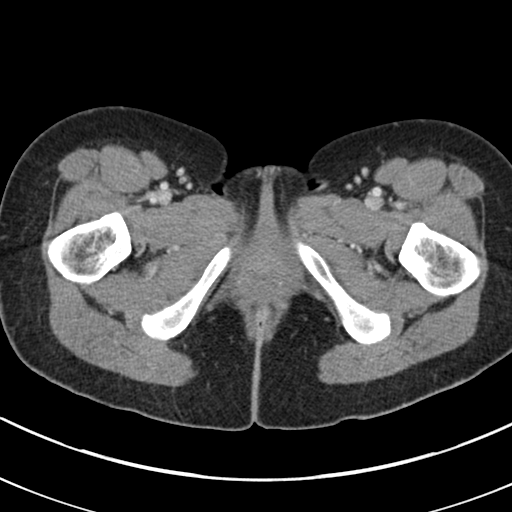
[im 4/86  bone]
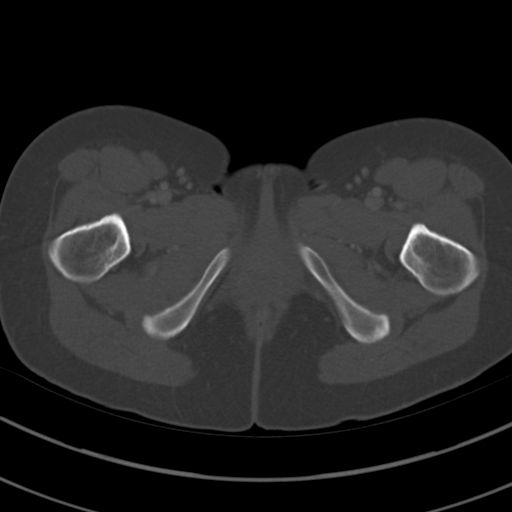
[im 12/86  soft-tissue]
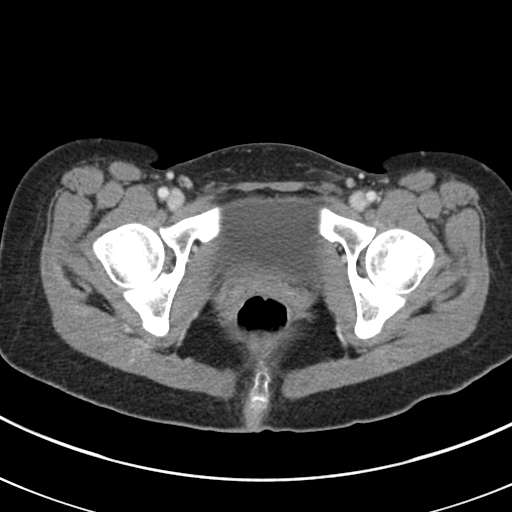
[im 20/86  soft-tissue]
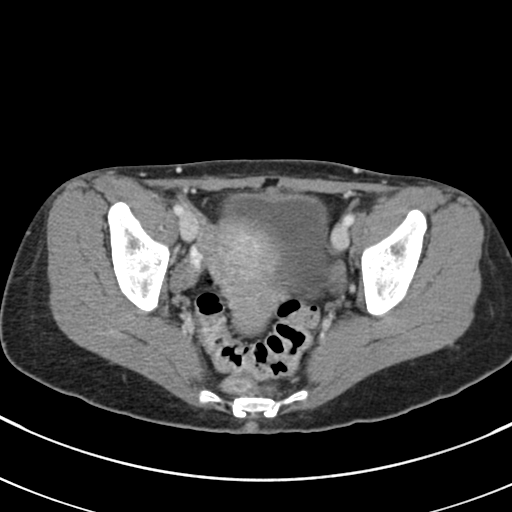
[im 28/86  soft-tissue]
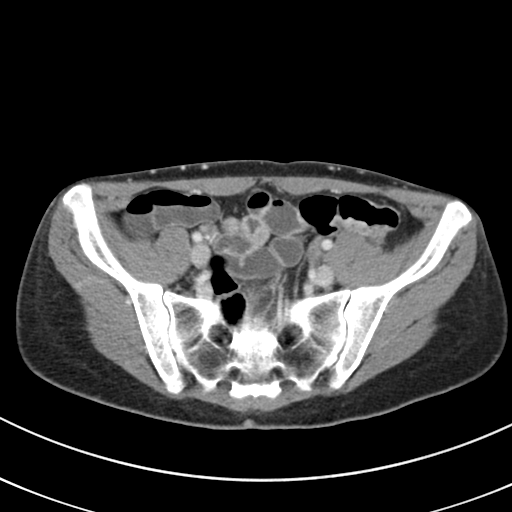
[im 31/86  soft-tissue]
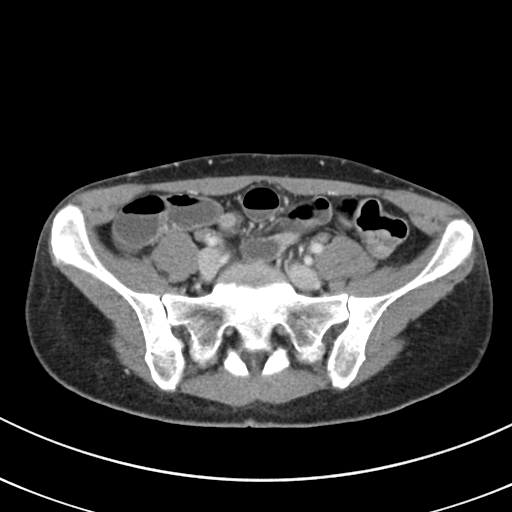
[im 39/86  soft-tissue]
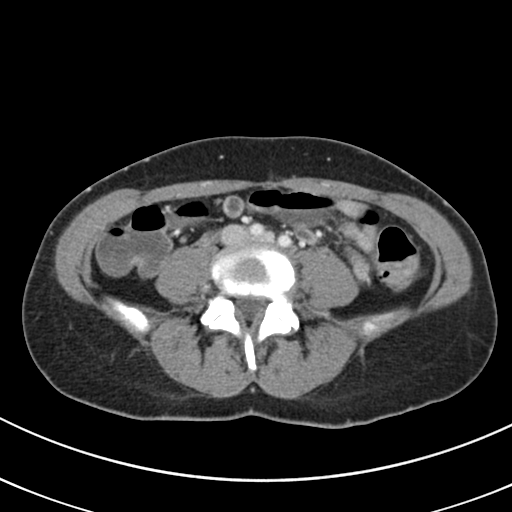
[im 47/86  soft-tissue]
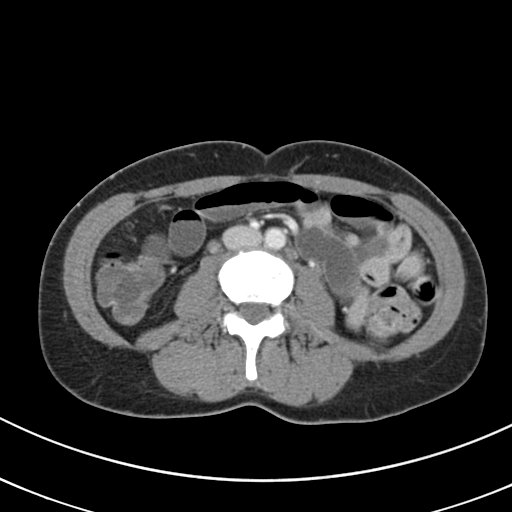
[im 55/86  soft-tissue]
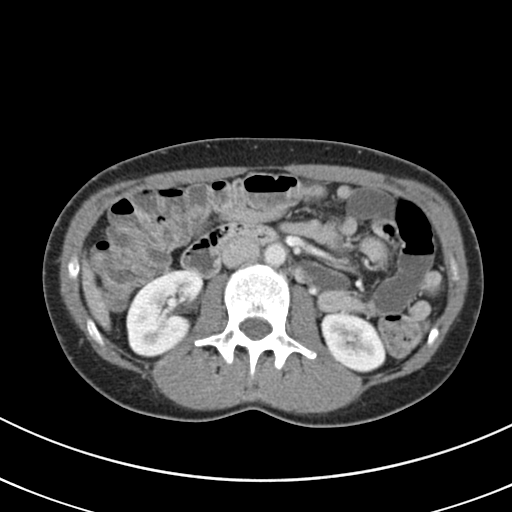
[im 58/86  soft-tissue]
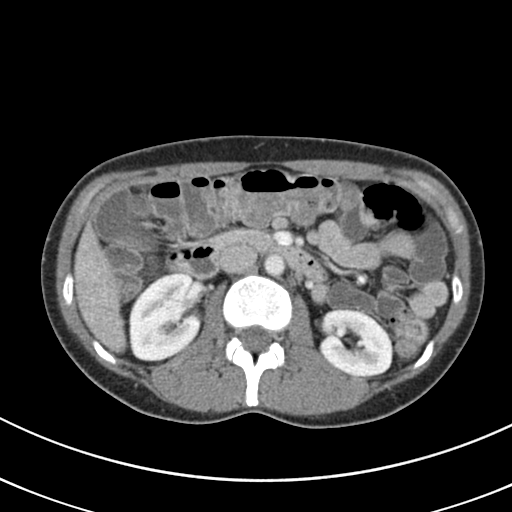
[im 58/86  bone]
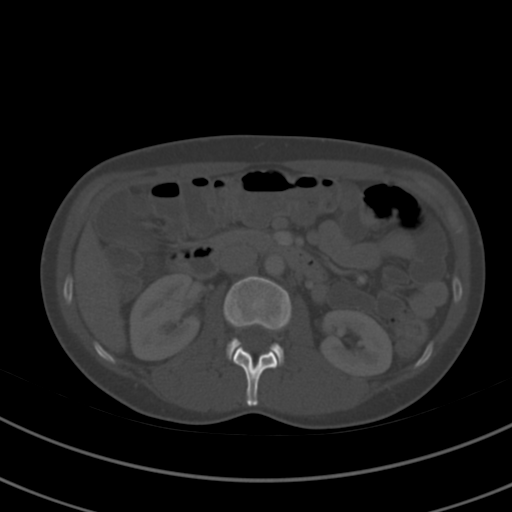
[im 66/86  soft-tissue]
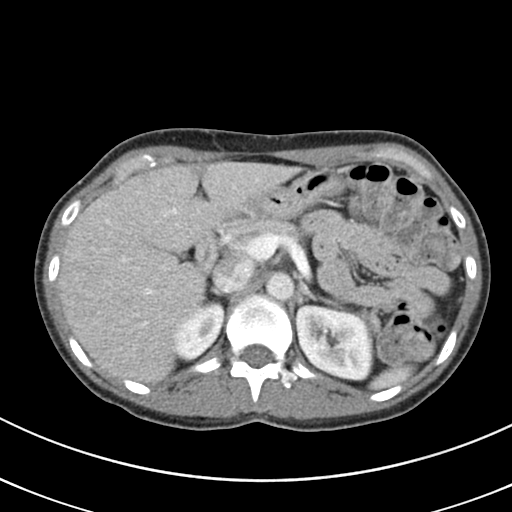
[im 74/86  soft-tissue]
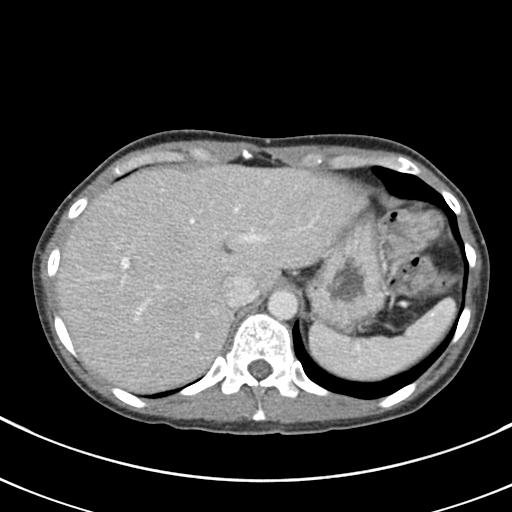
[im 82/86  soft-tissue]
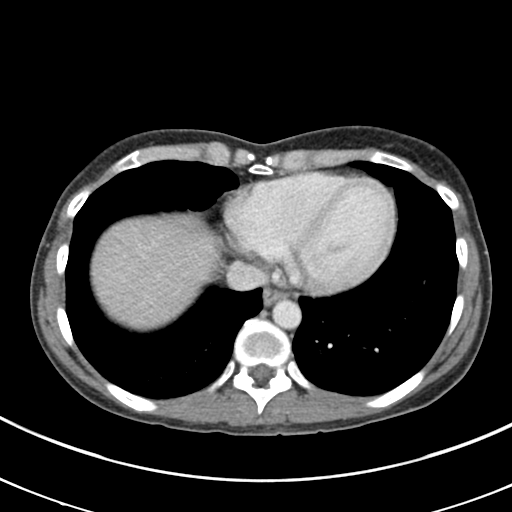

[Series 5: coronal st · coronal · 0.60mm/px · 3 of 61 slices shown]
[im 21/61  soft-tissue]
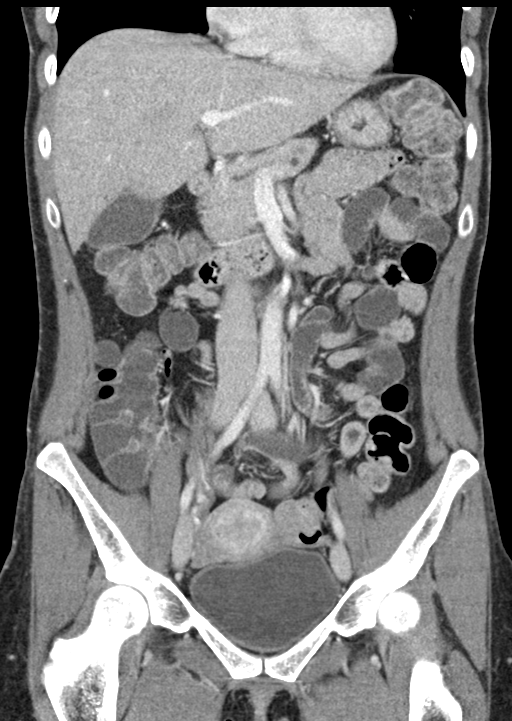
[im 27/61  soft-tissue]
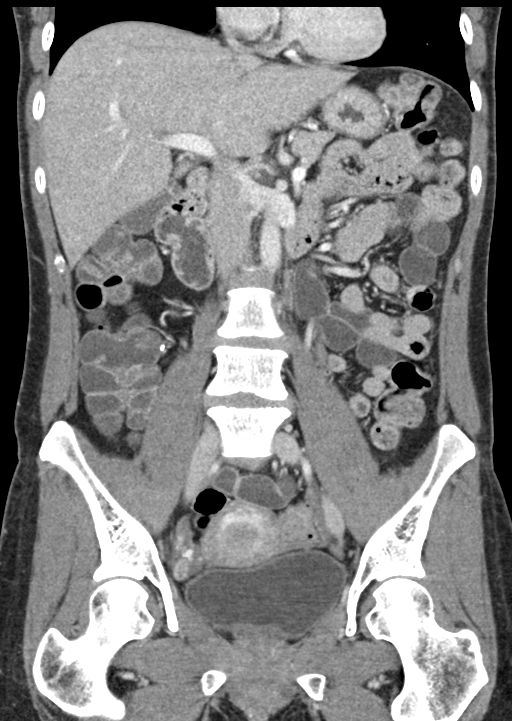
[im 34/61  soft-tissue]
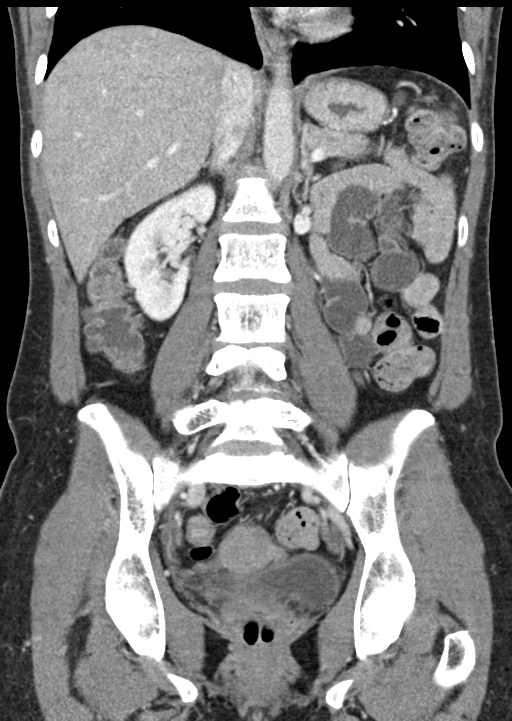

[15 of 46 positions shown; findings below may reference images not displayed]

FINDINGS: Lower chest: No acute abnormality.

Hepatobiliary: No focal liver abnormality is seen. No gallstones,
gallbladder wall thickening, or biliary dilatation.

Pancreas: Stable 7 mm cyst within the pancreatic body (series 2,
image 20 and series 6, image 58) from 7287. No inflammatory change
or main duct dilatation.

Spleen: Normal in size without focal abnormality.

Adrenals/Urinary Tract: Adrenal glands are unremarkable. Kidneys are
normal, without renal calculi, focal lesion, or hydronephrosis.
Bladder is unremarkable.

Stomach/Bowel: Stomach is within normal limits. Appendix appears
normal. No evidence of bowel wall thickening, distention, or
inflammatory changes.

Vascular/Lymphatic: No significant vascular findings are present. No
enlarged abdominal or pelvic lymph nodes.

Reproductive: Uterus and bilateral adnexa are unremarkable.

Other: No abdominal wall hernia or abnormality. No abdominopelvic
ascites.

Musculoskeletal: Right chronic L5 pars defects. No acute fracture
identified.
IMPRESSION: 1. No acute process identified.
2. Stable 7 mm cyst within body of pancreas from 7287, probably a
side branch IPMN, 2 year follow-up with pancreas protocol CT or MRI
is recommended. This recommendation follows ACR consensus
guidelines: Management of Incidental Pancreatic Cysts: A White Paper
of the ACR Incidental Findings Committee. [HOSPITAL]
3. Right chronic L5 pars defect.

By: Pasteur Sie M.D.
